# Patient Record
Sex: Female | Born: 1935 | Race: Black or African American | Hispanic: No | Smoking: Former smoker
Health system: Southern US, Community
[De-identification: ages and names within clinical notes are randomized; demographics above are authoritative.]

## PROBLEM LIST (undated history)

## (undated) DIAGNOSIS — I2510 Atherosclerotic heart disease of native coronary artery without angina pectoris: Secondary | ICD-10-CM

## (undated) DIAGNOSIS — M549 Dorsalgia, unspecified: Secondary | ICD-10-CM

## (undated) DIAGNOSIS — N200 Calculus of kidney: Secondary | ICD-10-CM

## (undated) DIAGNOSIS — E785 Hyperlipidemia, unspecified: Secondary | ICD-10-CM

## (undated) DIAGNOSIS — K219 Gastro-esophageal reflux disease without esophagitis: Secondary | ICD-10-CM

## (undated) DIAGNOSIS — I10 Essential (primary) hypertension: Secondary | ICD-10-CM

## (undated) DIAGNOSIS — G8929 Other chronic pain: Secondary | ICD-10-CM

## (undated) HISTORY — DX: Atherosclerotic heart disease of native coronary artery without angina pectoris: I25.10

## (undated) HISTORY — DX: Dorsalgia, unspecified: M54.9

## (undated) HISTORY — DX: Gastro-esophageal reflux disease without esophagitis: K21.9

## (undated) HISTORY — DX: Essential (primary) hypertension: I10

## (undated) HISTORY — DX: Other chronic pain: G89.29

## (undated) HISTORY — DX: Hyperlipidemia, unspecified: E78.5

---

## 1998-04-13 ENCOUNTER — Encounter: Admission: RE | Admit: 1998-04-13 | Discharge: 1998-05-04 | Payer: Self-pay | Admitting: Internal Medicine

## 1998-04-20 ENCOUNTER — Encounter: Admission: RE | Admit: 1998-04-20 | Discharge: 1998-05-04 | Payer: Self-pay | Admitting: Internal Medicine

## 1998-07-01 ENCOUNTER — Emergency Department (HOSPITAL_COMMUNITY): Admission: EM | Admit: 1998-07-01 | Discharge: 1998-07-01 | Payer: Self-pay | Admitting: Emergency Medicine

## 2004-11-11 HISTORY — PX: CARDIAC CATHETERIZATION: SHX172

## 2006-08-04 ENCOUNTER — Inpatient Hospital Stay (HOSPITAL_COMMUNITY): Admission: EM | Admit: 2006-08-04 | Discharge: 2006-08-06 | Payer: Self-pay | Admitting: Emergency Medicine

## 2006-08-04 ENCOUNTER — Ambulatory Visit: Payer: Self-pay | Admitting: Cardiology

## 2006-08-05 ENCOUNTER — Encounter: Payer: Self-pay | Admitting: Cardiovascular Disease

## 2006-08-06 ENCOUNTER — Ambulatory Visit: Payer: Self-pay

## 2006-12-16 ENCOUNTER — Encounter: Admission: RE | Admit: 2006-12-16 | Discharge: 2006-12-16 | Payer: Self-pay | Admitting: Internal Medicine

## 2008-01-15 ENCOUNTER — Encounter: Payer: Self-pay | Admitting: Emergency Medicine

## 2008-01-15 ENCOUNTER — Emergency Department (HOSPITAL_COMMUNITY): Admission: EM | Admit: 2008-01-15 | Discharge: 2008-01-15 | Payer: Self-pay | Admitting: Emergency Medicine

## 2008-02-27 ENCOUNTER — Emergency Department (HOSPITAL_COMMUNITY): Admission: EM | Admit: 2008-02-27 | Discharge: 2008-02-27 | Payer: Self-pay | Admitting: Emergency Medicine

## 2008-04-19 ENCOUNTER — Encounter: Admission: RE | Admit: 2008-04-19 | Discharge: 2008-04-19 | Payer: Self-pay | Admitting: Orthopedic Surgery

## 2008-07-25 ENCOUNTER — Encounter: Admission: RE | Admit: 2008-07-25 | Discharge: 2008-09-08 | Payer: Self-pay | Admitting: Orthopedic Surgery

## 2008-10-11 DIAGNOSIS — I2510 Atherosclerotic heart disease of native coronary artery without angina pectoris: Secondary | ICD-10-CM

## 2008-10-11 HISTORY — DX: Atherosclerotic heart disease of native coronary artery without angina pectoris: I25.10

## 2008-10-26 ENCOUNTER — Ambulatory Visit: Payer: Self-pay | Admitting: *Deleted

## 2008-10-26 ENCOUNTER — Encounter: Payer: Self-pay | Admitting: Emergency Medicine

## 2008-10-27 ENCOUNTER — Inpatient Hospital Stay (HOSPITAL_COMMUNITY): Admission: EM | Admit: 2008-10-27 | Discharge: 2008-10-29 | Payer: Self-pay | Admitting: Cardiology

## 2008-11-17 ENCOUNTER — Ambulatory Visit: Payer: Self-pay | Admitting: Cardiovascular Disease

## 2008-11-17 ENCOUNTER — Ambulatory Visit: Payer: Self-pay

## 2009-05-24 ENCOUNTER — Encounter: Admission: RE | Admit: 2009-05-24 | Discharge: 2009-05-24 | Payer: Self-pay | Admitting: Internal Medicine

## 2009-05-29 ENCOUNTER — Ambulatory Visit: Payer: Self-pay | Admitting: Cardiovascular Disease

## 2009-05-29 DIAGNOSIS — I2510 Atherosclerotic heart disease of native coronary artery without angina pectoris: Secondary | ICD-10-CM

## 2009-05-29 DIAGNOSIS — I10 Essential (primary) hypertension: Secondary | ICD-10-CM

## 2009-05-29 DIAGNOSIS — E782 Mixed hyperlipidemia: Secondary | ICD-10-CM | POA: Insufficient documentation

## 2009-06-16 ENCOUNTER — Encounter: Admission: RE | Admit: 2009-06-16 | Discharge: 2009-06-16 | Payer: Self-pay | Admitting: Internal Medicine

## 2009-11-15 ENCOUNTER — Ambulatory Visit: Payer: Self-pay | Admitting: Cardiovascular Disease

## 2009-12-19 ENCOUNTER — Ambulatory Visit (HOSPITAL_COMMUNITY): Admission: RE | Admit: 2009-12-19 | Discharge: 2009-12-19 | Payer: Self-pay | Admitting: Cardiovascular Disease

## 2009-12-19 ENCOUNTER — Ambulatory Visit: Payer: Self-pay | Admitting: Cardiovascular Disease

## 2009-12-19 ENCOUNTER — Ambulatory Visit: Payer: Self-pay | Admitting: Cardiology

## 2010-04-12 ENCOUNTER — Ambulatory Visit: Payer: Self-pay | Admitting: Cardiovascular Disease

## 2010-04-24 ENCOUNTER — Emergency Department (HOSPITAL_COMMUNITY): Admission: EM | Admit: 2010-04-24 | Discharge: 2010-04-24 | Payer: Self-pay | Admitting: Emergency Medicine

## 2010-09-28 ENCOUNTER — Encounter: Payer: Self-pay | Admitting: Internal Medicine

## 2010-10-16 ENCOUNTER — Ambulatory Visit: Payer: Self-pay | Admitting: Cardiovascular Disease

## 2010-12-02 ENCOUNTER — Encounter: Payer: Self-pay | Admitting: Internal Medicine

## 2010-12-03 ENCOUNTER — Encounter: Payer: Self-pay | Admitting: Internal Medicine

## 2010-12-11 NOTE — Assessment & Plan Note (Signed)
Summary: per check out/sf   Visit Type:  Follow-up Primary Provider:  Andi Devon, MD  CC:  ROV; No complaints (Meds per memory-no list).  History of Present Illness: Kiara Watkins is a pleasant 77 year old African American female with a past medical history significant for hypertension, hyperlipidemia, and coronary artery disease who presented to [facility] in December 2009 with a non-ST elevation myocardial infarction.  Left heart catheterization on October 27, 2008, showed a totally occluded first obtuse marginal branch and otherwise nonobstructive disease with normal LV function. The patient was treated with a 2.75- x 23-mm XIENCE drug-eluting stent.  She tells me that she woke up with a bad headache today and has been a little dizzy. She has been doing well, no chest pain, shortness of breath, near syncope, syncope, lower ext edema. She has continued to take her Plavix on a daily basis.    Current Medications (verified): 1)  Plavix 75 Mg Tabs (Clopidogrel Bisulfate) .Marland Kitchen.. 1 Tab Once Daily 2)  Lisinopril 10 Mg Tabs (Lisinopril) .Marland Kitchen.. 1 Tab Once Daily 3)  Metoprolol Tartrate 25 Mg Tabs (Metoprolol Tartrate) .Marland Kitchen.. 1 Tab Two Times A Day 4)  Simvastatin 40 Mg Tabs (Simvastatin) .Marland Kitchen.. 1 Tab At Bedtime 5)  Aspirin 81 Mg Tbec (Aspirin) .... Take One Tablet By Mouth Daily 6)  Nitroglycerin 0.4 Mg Subl (Nitroglycerin) .... One Tablet Under Tongue Every 5 Minutes As Needed For Chest Pain---May Repeat Times Three  Allergies (verified): No Known Drug Allergies  Past History:  Past Medical History: Reviewed history from 11/17/2008 and no changes required. 1. Coronary artery disease status post successful percutaneous       coronary intervention and stenting of the first obtuse marginal       with placement of a 2.75 x 23 mm XIENCE V drug-eluting 12/09  2. Hypertension.   3. Hyperlipidemia.   4. Gastroesophageal reflux disease.   5. Chronic back pain.   Social History: Reviewed  history from 11/15/2009 and no changes required. The patient lives in [place] and currently cares for   her 9 great-great-great-grandchildren. SHe has a total of 56 grandchildren, great grands and great great grands  She has 9 children of her own  who are alive and well.   She quit smoking approximately 30 years ago as   well as alcohol.   Review of Systems       The patient complains of headache and dizziness.  The patient denies fatigue, malaise, fever, weight gain/loss, vision loss, decreased hearing, hoarseness, chest pain, palpitations, shortness of breath, prolonged cough, wheezing, sleep apnea, coughing up blood, abdominal pain, blood in stool, nausea, vomiting, diarrhea, heartburn, incontinence, blood in urine, muscle weakness, joint pain, leg swelling, rash, skin lesions, fainting, depression, anxiety, enlarged lymph nodes, easy bruising or bleeding, and environmental allergies.    Vital Signs:  Patient profile:   77 year old female Height:      64 inches Weight:      150 pounds BMI:     25.84 Pulse rate:   68 / minute Pulse rhythm:   regular BP sitting:   102 / 42  (left arm) Cuff size:   regular  Vitals Entered By: Stanton Kidney, EMT-P (April 12, 2010 2:14 PM)  Physical Exam  General:  General: Well developed, well nourished, NAD Neuro: No focal deficits Musculoskeletal: Muscle strength 5/5 all ext Psychiatric: Mood and affect normal Neck: No JVD, no carotid bruits, no thyromegaly, no lymphadenopathy. Lungs:Clear bilaterally, no wheezes, rhonci, crackles CV: RRR  no murmurs, gallops rubs Abdomen: soft, NT, ND, BS present Extremities: No edema, pulses 2+.    EKG  Procedure date:  04/12/2010  Findings:      Sinus rhythm, rate 70 bpm. 1st degree AV block.   Impression & Recommendations:  Problem # 1:  CAD, NATIVE VESSEL (ICD-414.01) Stable. She is now 18 months post NSTEMI with drug eluting stent. Will stop Plavix. Continue ASA, beta blocker, statin, ace-inh.    Her updated medication list for this problem includes:    Plavix 75 Mg Tabs (Clopidogrel bisulfate) .Marland Kitchen... 1 tab once daily    Lisinopril 10 Mg Tabs (Lisinopril) .Marland Kitchen... 1 tab once daily    Metoprolol Tartrate 25 Mg Tabs (Metoprolol tartrate) .Marland Kitchen... 1 tab two times a day    Aspirin 81 Mg Tbec (Aspirin) .Marland Kitchen... Take one tablet by mouth daily    Nitroglycerin 0.4 Mg Subl (Nitroglycerin) ..... One tablet under tongue every 5 minutes as needed for chest pain---may repeat times three  Problem # 2:  ESSENTIAL HYPERTENSION, BENIGN (ICD-401.1) Well controlled.  No changes.  Her updated medication list for this problem includes:    Lisinopril 10 Mg Tabs (Lisinopril) .Marland Kitchen... 1 tab once daily    Metoprolol Tartrate 25 Mg Tabs (Metoprolol tartrate) .Marland Kitchen... 1 tab two times a day    Aspirin 81 Mg Tbec (Aspirin) .Marland Kitchen... Take one tablet by mouth daily  Problem # 3:  MIXED HYPERLIPIDEMIA (ICD-272.2) Folllowed in primary care. She will need yearly lipids with goal LDL 70.   Her updated medication list for this problem includes:    Simvastatin 40 Mg Tabs (Simvastatin) .Marland Kitchen... 1 tab at bedtime  Patient Instructions: 1)  Your physician recommends that you schedule a follow-up appointment in: 6 months 2)  Your physician has recommended you make the following change in your medication: Stop Plavix when you finish this bottle.

## 2010-12-11 NOTE — Assessment & Plan Note (Signed)
Summary: Z6X  Medications Added AMLODIPINE BESY-BENAZEPRIL HCL 10-20 MG CAPS (AMLODIPINE BESY-BENAZEPRIL HCL) 1 tab once daily METOPROLOL TARTRATE 25 MG TABS (METOPROLOL TARTRATE) 1 tab once daily      Allergies Added: NKDA  Visit Type:  6 mo f/u Primary Provider:  Andi Devon, MD  CC:  pt offers no cardiac complaints today.  History of Present Illness: Kiara Watkins is a pleasant 77 year old African American female with a past medical history significant for hypertension, hyperlipidemia, and coronary artery disease who presented to [facility] in December 2009 with a non-ST elevation myocardial infarction.  Left heart catheterization on October 27, 2008, showed a totally occluded first obtuse marginal branch and otherwise nonobstructive disease with normal LV function. The patient was treated with a 2.75- x 23-mm XIENCE drug-eluting stent.  She has been doing well, no chest pain, shortness of breath, near syncope, syncope, lower ext edema.    Current Medications (verified): 1)  Amlodipine Besy-Benazepril Hcl 10-20 Mg Caps (Amlodipine Besy-Benazepril Hcl) .Marland Kitchen.. 1 Tab Once Daily 2)  Metoprolol Tartrate 25 Mg Tabs (Metoprolol Tartrate) .Marland Kitchen.. 1 Tab Once Daily 3)  Simvastatin 40 Mg Tabs (Simvastatin) .Marland Kitchen.. 1 Tab At Bedtime 4)  Aspirin 81 Mg Tbec (Aspirin) .... Take One Tablet By Mouth Daily 5)  Nitroglycerin 0.4 Mg Subl (Nitroglycerin) .... One Tablet Under Tongue Every 5 Minutes As Needed For Chest Pain---May Repeat Times Three  Allergies (verified): No Known Drug Allergies  Past History:  Past Medical History: Reviewed history from 11/17/2008 and no changes required. 1. Coronary artery disease status post successful percutaneous       coronary intervention and stenting of the first obtuse marginal       with placement of a 2.75 x 23 mm XIENCE V drug-eluting 12/09  2. Hypertension.   3. Hyperlipidemia.   4. Gastroesophageal reflux disease.   5. Chronic back pain.    Social History: Reviewed history from 11/15/2009 and no changes required. The patient lives in [place] and currently cares for   her 9 great-great-great-grandchildren. SHe has a total of 56 grandchildren, great grands and great great grands  She has 9 children of her own  who are alive and well.   She quit smoking approximately 30 years ago as   well as alcohol.   Review of Systems  The patient denies fatigue, malaise, fever, weight gain/loss, vision loss, decreased hearing, hoarseness, chest pain, palpitations, shortness of breath, prolonged cough, wheezing, sleep apnea, coughing up blood, abdominal pain, blood in stool, nausea, vomiting, diarrhea, heartburn, incontinence, blood in urine, muscle weakness, joint pain, leg swelling, rash, skin lesions, headache, fainting, dizziness, depression, anxiety, enlarged lymph nodes, easy bruising or bleeding, and environmental allergies.    Vital Signs:  Patient profile:   77 year old female Height:      64 inches Weight:      155.50 pounds BMI:     26.79 Pulse rate:   72 / minute Pulse rhythm:   regular BP sitting:   142 / 68  (left arm) Cuff size:   regular  Vitals Entered By: Kiara Watkins, Kiara Watkins (October 16, 2010 2:07 PM)  Physical Exam  General:  General: Well developed, well nourished, NAD HEENT: OP clear, mucus membranes moist SKIN: warm, dry Neuro: No focal deficits Musculoskeletal: Muscle strength 5/5 all ext Psychiatric: Mood and affect normal Neck: No JVD, no carotid bruits, no thyromegaly, no lymphadenopathy. Lungs:Clear bilaterally, no wheezes, rhonci, crackles CV: RRR no murmurs, gallops rubs Abdomen: soft, NT, ND, BS  present Extremities: No edema, pulses 2+.    Impression & Recommendations:  Problem # 1:  CAD, NATIVE VESSEL (ICD-414.01) Stable. No angina. Continue current meds. We will ask Dr. Renae Gloss to check her fasting lipids at her primary care follow up.   The following medications were removed from the  medication list:    Plavix 75 Mg Tabs (Clopidogrel bisulfate) .Marland Kitchen... 1 tab once daily Her updated medication list for this problem includes:    Amlodipine Besy-benazepril Hcl 10-20 Mg Caps (Amlodipine besy-benazepril hcl) .Marland Kitchen... 1 tab once daily    Metoprolol Tartrate 25 Mg Tabs (Metoprolol tartrate) .Marland Kitchen... 1 tab once daily    Aspirin 81 Mg Tbec (Aspirin) .Marland Kitchen... Take one tablet by mouth daily    Nitroglycerin 0.4 Mg Subl (Nitroglycerin) ..... One tablet under tongue every 5 minutes as needed for chest pain---may repeat times three  The following medications were removed from the medication list:    Plavix 75 Mg Tabs (Clopidogrel bisulfate) .Marland Kitchen... 1 tab once daily Her updated medication list for this problem includes:    Lisinopril 10 Mg Tabs (Lisinopril) .Marland Kitchen... 1 tab once daily    Metoprolol Tartrate 25 Mg Tabs (Metoprolol tartrate) .Marland Kitchen... 1 tab two times a day    Aspirin 81 Mg Tbec (Aspirin) .Marland Kitchen... Take one tablet by mouth daily    Nitroglycerin 0.4 Mg Subl (Nitroglycerin) ..... One tablet under tongue every 5 minutes as needed for chest pain---may repeat times three  Problem # 2:  ESSENTIAL HYPERTENSION, BENIGN (ICD-401.1) Borderline elevation today. Follow up with Dr. Renae Gloss soon and will have rechecked. She tells me that her BP has been ok at all recent checks.   Her updated medication list for this problem includes:    Amlodipine Besy-benazepril Hcl 10-20 Mg Caps (Amlodipine besy-benazepril hcl) .Marland Kitchen... 1 tab once daily    Metoprolol Tartrate 25 Mg Tabs (Metoprolol tartrate) .Marland Kitchen... 1 tab once daily    Aspirin 81 Mg Tbec (Aspirin) .Marland Kitchen... Take one tablet by mouth daily  Patient Instructions: 1)  Your physician recommends that you schedule a follow-up appointment in: 6 months 2)  Your physician recommends that you continue on your current medications as directed. Please refer to the Current Medication list given to you today.

## 2010-12-11 NOTE — Letter (Signed)
Summary: CLINIC NOTE FROM [facility]  CLINIC NOTE FROM [facility]   Imported By: Rexene Alberts 09/28/2010 10:40:35  _____________________________________________________________________  External Attachment:    Type:   Image     Comment:   External Document

## 2010-12-11 NOTE — Assessment & Plan Note (Signed)
Summary: F6M/ANAS      Allergies Added: NKDA  Visit Type:  6 mo f/u Primary Provider:  Andi Devon  CC:  no cardiac complaints today.  History of Present Illness: Kiara Watkins is a pleasant 77 year old African American female with a past medical history significant for hypertension, hyperlipidemia, and coronary artery disease who presented to [facility] in December 2009 with a non-ST elevation myocardial infarction.  Left heart catheterization on October 27, 2008, showed a totally occluded first obtuse marginal branch and otherwise nonobstructive disease with normal LV function. The patient was treated with a 2.75- x 23-mm XIENCE drug-eluting stent.  She has been doing well, but does describe several episodes of mild chest discomfort for which she has taken nitroglycerin.  Chest pain is left sided, mild in nature, and resolves completely with one sublingual nitroglycerin.  No shortness of breath, near syncope, syncope, lower ext edema. She has continued to take her Plavix on a daily basis.  She recently went to the beach with her kids and felt great. She is planning a trip to the [place] soon.   Current Medications (verified): 1)  Plavix 75 Mg Tabs (Clopidogrel Bisulfate) .Marland Kitchen.. 1 Tab Once Daily 2)  Lisinopril 10 Mg Tabs (Lisinopril) .Marland Kitchen.. 1 Tab Once Daily 3)  Metoprolol Tartrate 25 Mg Tabs (Metoprolol Tartrate) .Marland Kitchen.. 1 Tab Two Times A Day 4)  Simvastatin 40 Mg Tabs (Simvastatin) .Marland Kitchen.. 1 Tab At Bedtime 5)  Aspirin 81 Mg Tbec (Aspirin) .... Take One Tablet By Mouth Daily 6)  Nitroglycerin 0.4 Mg Subl (Nitroglycerin) .... One Tablet Under Tongue Every 5 Minutes As Needed For Chest Pain---May Repeat Times Three  Allergies (verified): No Known Drug Allergies  Past History:  Past Medical History: Reviewed history from 11/17/2008 and no changes required. 1. Coronary artery disease status post successful percutaneous       coronary intervention and stenting of the first obtuse  marginal       with placement of a 2.75 x 23 mm XIENCE V drug-eluting 12/09  2. Hypertension.   3. Hyperlipidemia.   4. Gastroesophageal reflux disease.   5. Chronic back pain.   Social History: Reviewed history from 11/17/2008 and no changes required. The patient lives in [place] and currently cares for   her 9 great-great-great-grandchildren. SHe has a total of 56 grandchildren, great grands and great great grands  She has 9 children of her own  who are alive and well.   She quit smoking approximately 30 years ago as   well as alcohol.   Review of Systems  The patient denies fatigue, malaise, fever, weight gain/loss, vision loss, decreased hearing, hoarseness, chest pain, palpitations, shortness of breath, prolonged cough, wheezing, sleep apnea, coughing up blood, abdominal pain, blood in stool, nausea, vomiting, diarrhea, heartburn, incontinence, blood in urine, muscle weakness, joint pain, leg swelling, rash, skin lesions, headache, fainting, dizziness, depression, anxiety, enlarged lymph nodes, easy bruising or bleeding, and environmental allergies.    Vital Signs:  Patient profile:   77 year old female Height:      66 inches Weight:      154 pounds BMI:     24.95 Pulse rate:   86 / minute Pulse rhythm:   regular BP sitting:   130 / 76  (left arm) Cuff size:   large  Vitals Entered By: Danielle Rankin, CMA (November 15, 2009 3:50 PM)  Physical Exam  General:  General: Well developed, well nourished, NAD HEENT: OP clear, mucus membranes moist SKIN: warm,  dry Neuro: No focal deficits Musculoskeletal: Muscle strength 5/5 all ext Psychiatric: Mood and affect normal Neck: No JVD, no carotid bruits, no thyromegaly, no lymphadenopathy. Lungs:Clear bilaterally, no wheezes, rhonci, crackles CV: RRR no murmurs, gallops rubs Abdomen: soft, NT, ND, BS present Extremities: No edema, pulses 2+.    Impression & Recommendations:  Problem # 1:  CAD, NATIVE VESSEL  (ICD-414.01) Stable. Continue current meds.  Will check echo in next few weeks.   Her updated medication list for this problem includes:    Plavix 75 Mg Tabs (Clopidogrel bisulfate) .Marland Kitchen... 1 tab once daily    Lisinopril 10 Mg Tabs (Lisinopril) .Marland Kitchen... 1 tab once daily    Metoprolol Tartrate 25 Mg Tabs (Metoprolol tartrate) .Marland Kitchen... 1 tab two times a day    Aspirin 81 Mg Tbec (Aspirin) .Marland Kitchen... Take one tablet by mouth daily    Nitroglycerin 0.4 Mg Subl (Nitroglycerin) ..... One tablet under tongue every 5 minutes as needed for chest pain---may repeat times three  Orders: Echocardiogram (Echo)  Problem # 2:  ESSENTIAL HYPERTENSION, BENIGN (ICD-401.1) Well controlled on current therapy.   Her updated medication list for this problem includes:    Lisinopril 10 Mg Tabs (Lisinopril) .Marland Kitchen... 1 tab once daily    Metoprolol Tartrate 25 Mg Tabs (Metoprolol tartrate) .Marland Kitchen... 1 tab two times a day    Aspirin 81 Mg Tbec (Aspirin) .Marland Kitchen... Take one tablet by mouth daily  Problem # 3:  MIXED HYPERLIPIDEMIA (ICD-272.2) Per Dr. Renae Gloss.  Her updated medication list for this problem includes:    Simvastatin 40 Mg Tabs (Simvastatin) .Marland Kitchen... 1 tab at bedtime  Patient Instructions: 1)  Your physician recommends that you schedule a follow-up appointment in: 6 months 2)  Your physician has requested that you have an echocardiogram.  Echocardiography is a painless test that uses sound waves to create images of your heart. It provides your doctor with information about the size and shape of your heart and how well your heart's chambers and valves are working.  This procedure takes approximately one hour. There are no restrictions for this procedure.

## 2011-01-28 LAB — URINE CULTURE
Colony Count: NO GROWTH
Culture: NO GROWTH

## 2011-01-28 LAB — URINALYSIS, ROUTINE W REFLEX MICROSCOPIC
Glucose, UA: NEGATIVE mg/dL
Ketones, ur: NEGATIVE mg/dL
Nitrite: NEGATIVE
Protein, ur: NEGATIVE mg/dL
Specific Gravity, Urine: 1.01 (ref 1.005–1.030)
Specific Gravity, Urine: 1.023 (ref 1.005–1.030)
Urobilinogen, UA: 1 mg/dL (ref 0.0–1.0)

## 2011-01-28 LAB — POCT I-STAT, CHEM 8
BUN: 14 mg/dL (ref 6–23)
Glucose, Bld: 86 mg/dL (ref 70–99)
Hemoglobin: 15.3 g/dL — ABNORMAL HIGH (ref 12.0–15.0)
Potassium: 3.7 mEq/L (ref 3.5–5.1)

## 2011-01-28 LAB — CBC
HCT: 39.5 % (ref 36.0–46.0)
MCHC: 31.7 g/dL (ref 30.0–36.0)
MCV: 86.2 fL (ref 78.0–100.0)
Platelets: 218 10*3/uL (ref 150–400)
RDW: 15.3 % (ref 11.5–15.5)
WBC: 5.9 10*3/uL (ref 4.0–10.5)

## 2011-01-28 LAB — URINE MICROSCOPIC-ADD ON

## 2011-01-28 LAB — DIFFERENTIAL
Basophils Absolute: 0 10*3/uL (ref 0.0–0.1)
Neutro Abs: 4.5 10*3/uL (ref 1.7–7.7)
Neutrophils Relative %: 76 % (ref 43–77)

## 2011-01-28 LAB — POCT CARDIAC MARKERS: Troponin i, poc: <0.05 ng/mL (ref 0.00–0.09)

## 2011-03-26 NOTE — Cardiovascular Report (Signed)
NAME:  GEORGEANNA, RADZIEWICZ                 ACCOUNT NO.:  [id number]   MEDICAL RECORD NO.:  [id number]          PATIENT TYPE:  INP   LOCATION:  3313                         FACILITY:  [facility]   PHYSICIAN:  Verne Carrow, MDDATE OF BIRTH:  03-26-1927   DATE OF PROCEDURE:  10/27/2008  DATE OF DISCHARGE:                            CARDIAC CATHETERIZATION   PROCEDURES PERFORMED:  1. Left heart catheterization.  2. Selective coronary angiography.  3. Left ventricular angiogram.  4. Percutaneous coronary intervention with placement of a drug-eluting      stent in the first obtuse marginal branch.   OPERATOR:  Verne Carrow, MD   INDICATION:  Non-ST elevation myocardial infarction.   DETAILS OF PROCEDURE:  The patient was brought to the cardiac  catheterization laboratory after signing informed consent for the  procedure.  The right groin was prepped and draped in sterile fashion.  A 1% lidocaine was used for local anesthesia.  A 5-French sheath was  inserted into the right femoral artery without difficulty.  I initially  had trouble selectively engaging the right coronary artery with the JR-4  catheter and a No Torque right catheter.  I then proceeded to use the  standard JL-4 diagnostic catheter to selectively engage and inject the  left coronary system.  I then used an AR-1 catheter to selectively  inject the right coronary artery.  At this point, we turned our  attention to the totally occluded obtuse marginal branch.  The patient  was given an Angiomax bolus and started on a drip.  She was then given  600 mg of Plavix.  A Whisper wire was then passed down the circumflex  artery into the obtuse marginal branch and through the area of  obstruction.  A long wire was used for this procedure.  I then used an  over-the-wire balloon and passed this down over the wire.  The wire was  removed and contrast was injected through the balloon to establish that  we were truly in the lumen.   There was good opacification of the distal  vessel after injecting through the balloon.  I was then satisfied that  my wire had been in the appropriate location in the distal vessel.  The  wire was reinserted and the 1.5 x 12-mm balloon was inflated at the area  of stenosis.  This established TIMI 3 flow down the length of the  vessel.  A 2.5 x 12-mm balloon was then inflated in the area of  stenosis.  A 2.75 x 23-mm Xience drug-eluting stent was then deployed in  the area of stenosis.  A 3.0 x 20-mm noncompliant balloon was used for  postdilatation of the lesion.  There was an excellent angiographic  result.  The patient's Angiomax was stopped in the cath lab.  She was  taken to the holding area in stable condition.   ANGIOGRAPHIC FINDINGS:  1. The left main coronary artery had no evidence of disease.  2. The left anterior descending is a large vessel that courses to the      apex and gives off a large branching diagonal  vessel.  There are      serial 30% lesions noted in the proximal and mid LAD.  There is no      severe obstructive disease noted in this vessel.  There is plaque      disease noted in the diagonal branch.  3. Circumflex artery gives rise to 2 large obtuse marginal branches.      There is plaque disease noted in the proximal circumflex and a 40%      stenosis noted in the mid circumflex.  The AV groove circumflex      vessel was small.  The first obtuse marginal is a large vessel that      was totally occluded at the beginning of the procedure.  The second      obtuse marginal is a moderate-sized vessel that has plaque disease.  4. The right coronary artery has serial 50% lesions in the proximal      and mid areas.  This is a codominant vessel.  There was no severe      obstructive disease noted in this vessel.  5. Left ventricular angiogram demonstrates normal systolic function      with no wall motion abnormalities.  Ejection fraction 65%.   HEMODYNAMIC DATA:   Central aortic pressure 102/50, LV pressure 151/8,  end-diastolic pressure 14.   IMPRESSION:  1. Successful percutaneous coronary intervention of the totally      occluded obtuse marginal branch of the circumflex system.  2. Normal left ventricular systolic function.  3. Nonobstructive disease noted in the left anterior descending      coronary artery and the right coronary artery.   RECOMMENDATIONS:  The patient will be transferred to the Step-Down ICU.  She will be monitored closely today and through the evening.  She will  be discharged to home tomorrow if she does well.  The patient should be  continued on aspirin and Plavix for at least 1 year.       Verne Carrow, MD  Electronically Signed     CM/MEDQ  D:  10/27/2008  T:  10/28/2008  Job:  [id number]

## 2011-03-26 NOTE — Discharge Summary (Signed)
NAMEMYKIAH, SCHMUCK                 ACCOUNT NO.:  [id number]   MEDICAL RECORD NO.:  [id number]          PATIENT TYPE:  INP   LOCATION:  2027                         FACILITY:  [facility]   PHYSICIAN:  Jesse Sans. Wall, MD, FACCDATE OF BIRTH:  02-Feb-1927   DATE OF ADMISSION:  10/27/2008  DATE OF DISCHARGE:  10/29/2008                               DISCHARGE SUMMARY   PRIMARY CARDIOLOGIST:  Verne Carrow, MD   PRIMARY CARE PHYSICIAN:  Merlene Laughter. Renae Gloss, MD   DISCHARGE DIAGNOSIS:  Non-ST-segment elevation myocardial infarction.   SECONDARY DIAGNOSES:  1. Coronary artery disease status post successful percutaneous      coronary intervention and stenting of the first obtuse marginal      with placement of a 2.75 x 23 mm XIENCE V drug-eluting stent this      admission.  2. Hypertension.  3. Hyperlipidemia.  4. Gastroesophageal reflux disease.  5. Chronic back pain.   ALLERGIES:  No known drug allergies.   PROCEDURE:  Left heart cardiac catheterization revealing a large  occluded first obtuse marginal, otherwise, nonobstructive disease with  an ejection fraction of 65% without regional wall motion abnormalities.  The obtuse marginal was successfully stented with a XIENCE V drug-  eluting stent as above.   HISTORY OF PRESENT ILLNESS:  This is a 77 year old African American  female with prior history of hypertension, hyperlipidemia who was in her  usual state of health until 2 days prior to admission when she began to  experience exertional chest discomfort while performing housework.  Symptoms acutely worsened on the evening of October 25, 2008, awaking  the patient from sleep and becoming associated with nausea, vomiting,  and dyspnea.  She presented to the [facility] where she was  noted to be hypertensive with systolic pressures in the 190s.  ECG was  nonacute.  Her cardiac markers were abnormal, and the patient was  transferred to [facility] for evaluation and  management of non-ST-  segment elevation myocardial infarction.   HOSPITAL COURSE:  The patient eventually peaked her CK at 1116, MB at  170.9, and troponin I 33.92.  She did have recurrent chest discomfort on  October 27, 2008, and was taken semi-urgently to the Cath Lab where  catheterization revealed an occluded large first obtuse marginal with  otherwise nonobstructive disease and normal LV function.  The obtuse  marginal was felt to be the culprit vessel and this was successfully  stented with a 2.75 x 23 mm XIENCE V drug-eluting stent.  The patient  was maintained on aspirin, Plavix, beta-blocker, and statin and ACE  inhibitor postprocedure, all of which she has tolerated.  She was  transferred out to the floor on October 28, 2008, has been ambulating  without recurrent symptoms or limitations.  She has been evaluated by  Cardiac Rehab and outpatient referral has been made.   Notably, Ms. Haggar had a chest x-ray during this admission, which  showed right lower lobe opacity suspicious for pneumonia versus  atelectasis.  She has been afebrile with stable white blood cell count  throughout this admission.  There is no history of cough, chills, or  other respiratory symptoms.  We recommended followup chest x-ray as an  outpatient and this can be done with Dr. Renae Gloss.   DISCHARGE LABORATORY DATA:  Hemoglobin 10.7, hematocrit 33.5, WBC 5.6,  platelets 171, MCV 85.4.  Sodium 137, potassium 3.6, chloride 107, CO2  21, BUN 12, creatinine 0.81, glucose 96.  Total bilirubin 0.4, alkaline  phosphatase 134, AST 42, ALT 17, albumin 3.5.  CK 1116, MB 170.9,  troponin I 33.92.  Total cholesterol 183, triglycerides 42, HDL 47, LDL  128, calcium 9.5.   DISPOSITION:  The patient will be discharged home today in good  condition.   FOLLOWUP PLANS AND APPOINTMENTS:  We will arrange for followup with Dr.  Clifton James in approximately 2 weeks.  We have asked her to follow up with  Dr. Renae Gloss in 1  week.  The patient will require reevaluation of his  chest x-ray secondary to right lower lobe opacity.   DISCHARGE MEDICATIONS:  1. Aspirin 325 mg daily.  2. Plavix 75 mg daily.  3. Lisinopril 10 mg daily.  4. Metoprolol 25 mg b.i.d.  5. Simvastatin 40 mg nightly.  6. Nitroglycerin 0.4 mg sublingual p.r.n. chest pain.   OUTSTANDING LABORATORY DATA AND STUDIES:  Chest x-ray.   DURATION OF DISCHARGE ENCOUNTER:  60 minutes including physician time.      Nicolasa Ducking, ANP      Jesse Sans. Daleen Squibb, MD, [facility]  Electronically Signed    CB/MEDQ  D:  10/29/2008  T:  10/29/2008  Job:  [id number]   cc:   Merlene Laughter. Renae Gloss, M.D.

## 2011-03-26 NOTE — H&P (Signed)
NAMESANYIA, DINI                 ACCOUNT NO.:  [id number]   MEDICAL RECORD NO.:  [id number]          PATIENT TYPE:  INP   LOCATION:  2104                         FACILITY:  [facility]   PHYSICIAN:  Audery Amel, MD    DATE OF BIRTH:  [birth year]   DATE OF ADMISSION:  10/27/2008  DATE OF DISCHARGE:                              HISTORY & PHYSICAL   CHIEF COMPLAINT:  Chest pain.   HISTORY OF PRESENT ILLNESS:  Ms. Lakatos is a 77 year old African  American female with past medical history notable hypertension and  hyperlipidemia who presents to the emergency department with a chief  complaint chest pain.  The patient states that her symptoms began on  Monday while doing housework.  She characterizes a dull substernal chest  discomfort without radiation.  She denies any associated shortness of  breath or dyspnea on exertion but does note nausea with one episode of  emesis.  The patient states that her symptoms improved after the episode  of emesis, and therefore, she felt she had indigestion.  On Tuesday  evening, she was awakened from her sleep with very similar symptoms.  She took an Alka-Seltzer with some relief of her symptoms and went back  to bed.  On Wednesday, she again had chest discomfort while hanging  curtains which was very similar to the previously described.  At this  point, she presented to the emergency department for further evaluation.  On arrival, the patient was notably hypertensive with systolic in the  474Q.  Her initial EKG revealed normal sinus rhythm with no evidence of  acute injury or ischemia.  Her initial cardiac biomarkers by point of  care testing were positive.  In addition, she developed 8/10 chest  discomfort which was resolved with sublingual nitroglycerin x1.  On my  initial evaluation, the patient was hemodynamically stable but remained  significantly hypertensive.  She was otherwise without complaints.   PAST MEDICAL HISTORY:  1. Hypertension.  2.  Hyperlipidemia.  3. Chronic low-back pain.  4. Status post right shoulder surgery.   CURRENT MEDICATIONS:  1. Zocor 20 mg daily  2. Metoprolol 50 mg daily  3. Naproxen 500 mg b.i.d. p.r.n.   DRUG ALLERGIES:  No known drug allergies.   SOCIAL HISTORY:  The patient lives in [place] and currently cares for  her 9 great-great-great-grandchildren.  She has 9 children of her own  who are alive and well.  She quit smoking approximately 30 years ago as  well as alcohol.   FAMILY HISTORY:  There is no general history of coronary artery disease  in her family.   REVIEW OF SYSTEMS:  As per HPI.  Otherwise, a complete review of system  was obtained and as documented.   PHYSICAL EXAMINATION:  Blood pressure is 191/82, heart rate of 79, O2  saturations are 96 on room air.  GENERAL:  The patient is alert and oriented x3 in no acute stress,  pleasantly conversant.  HEENT:  Normocephalic, atraumatic, EOMI, PERL, nares patent, OP clear  without erythema or exudate.  NECK:  Supple, full range of motion,  and no appreciable JVD.  There is  no palpable thyromegaly or lymphadenopathy.  Carotid upstrokes are equal  and symmetric bilaterally.  No audible bruits.  CHEST:  Clear to auscultation bilaterally.  CARDIOVASCULAR:  Normal S1-S2 with a positive S4.  There is no audible  rubs or gallops.  Her PMI is laterally displaced in the left axillary  line.  ABDOMEN:  Soft, nontender, nondistended.  Positive bowel sounds.  No  hepatosplenomegaly.  EXTREMITIES:  Reveal trace bilateral lower extremity edema.  There is no  clubbing, cyanosis, inflammation, or ulceration.  NEUROLOGIC:  Grossly nonfocal.  PSYCHIATRIC:  Appropriate insight and judgment.   DATA REVIEWED:  Labs:  White count 6.5, hematocrit 42, platelet 231.  Sodium 142, potassium 3.5, chloride 109, BUN 11, creatinine 1.2, glucose  106, CO2 is 23.  CK 309, CK-MB 3.5, troponin I 0.51.   EKG:  Normal sinus rhythm with nonspecific ST  changes.  There is no  evidence of acute injury or ischemia.   IMPRESSION:  1. Non-ST-elevation myocardial infarction.  2. Hypertensive urgency.  3. Hyperlipidemia.  4. Gastroesophageal reflux disease.   PLAN:  Admit the patient to telemetry for further evaluation and  monitoring.  Will cycle cardiac biomarkers to establish trend.  She has  been initiated on unfractionated heparin drip, and we will continue with  this mode of anticoagulation.  We will continue aspirin 325 mg once  daily.  There is no indication for a 2B3 inhibitor at this point.  She  remains notably hypertensive, and we will initiate metoprolol 25 mg p.o.  q.6 h. as well as lisinopril 10 mg daily.  We will continue her  simvastatin but increase the dose 40 mg every night.  The patient will  likely require cardiac catheterization in the a.m.  We will make the  patient n.p.o. after midnight and administer IV fluids with Mucomyst.  She is currently at [facility].  However, we will need to arrange for  transfer to [facility] in the a.m.      Audery Amel, MD  Electronically Signed     SHG/MEDQ  D:  10/26/2008  T:  10/27/2008  Job:  [id number]

## 2011-03-26 NOTE — Assessment & Plan Note (Signed)
[facility]                            CARDIOLOGY OFFICE NOTE   NAME:Tiley, CHONTE RICKE                        MRN:          [id number]  DATE:11/17/2008                            DOB:          1927-03-20    PRIMARY CARE PHYSICIAN:  Merlene Laughter. Renae Gloss, MD   HISTORY OF PRESENT ILLNESS:  Kiara Watkins is a pleasant 77 year old  African American female with a past medical history significant for  hypertension, hyperlipidemia, and a new diagnosis of coronary artery  disease when she presented to [facility] in December 2009 with  a non-ST elevation myocardial infarction.  Left heart catheterization on  October 27, 2008, showed a totally occluded first obtuse marginal  branch and otherwise nonobstructive disease with normal LV function.  The patient was treated with a 2.75- x 23-mm XIENCE drug-eluting stent.  She was maintained on aspirin, Plavix, beta-blocker, a statin  medication, and an ACE inhibitor postprocedure and did well.  She was  discharged to home on October 29, 2008, and comes in today for her  first visit in our office.  She has been doing well, but does describe  several episodes of mild chest discomfort for which she has taken  nitroglycerin.  Chest pain is left sided, mild in nature, and resolves  completely with one sublingual nitroglycerin.  She has unfortunately not  been taking a full-strength aspirin as recommended on her discharge  planning.  She has continued to take her Plavix on a daily basis.  She  was restarted on cholesterol medication while in the hospital and has  been taking this as asked.  I have also reviewed her medications with  her today and found that she has been taking Lotrel combination pill  which consists of benazepril and amlodipine.  We discharged her on  lisinopril.  The Lotrel has an ACE inhibitor as part of this  combination.  I have taken all of her medication bottles and placed the  ones that she  should be taking in one group.   Overall, other than occasional chest pain which seems to be stable, she  has no complaints of shortness of breath, palpitations, dizziness, near  syncope, syncope, orthopnea, PND, or lower extremity edema.  She tells  me that she was called by Cardiac Rehabilitation yesterday and plans on  starting this soon.   PAST MEDICAL HISTORY:  1. Hypertension.  2. Hyperlipidemia.  3. Coronary artery disease with diagnosis established in December      2009, at which time the patient presented with a non-ST-elevation      myocardial infarction to [facility]  As noted above, she      had a drug-eluting stent placed in the first obtuse marginal branch      of the circumflex artery.  4. Gastroesophageal reflux disease.  5. Chronic back pain.   PAST SURGICAL HISTORY:  1. Right shoulder surgery.  2. Appendectomy.   ALLERGIES:  No known drug allergies.   CURRENT MEDICATIONS:  1. Plavix 75 mg once daily.  2. Lisinopril 10 mg once daily.  3.  Metoprolol 25 mg twice daily.  4. Simvastatin 40 mg once daily.  5. Lotrel 10/20 mg once daily (the patient was not discharged home on      Lotrel, but has been taking this as per prior planning.  She also      was not taking a full-strength aspirin as planned in her discharge      paperwork.)   SOCIAL HISTORY:  The patient used tobacco remotely, but currently denies  the use of any tobacco.  She stopped smoking 40 years ago.  She denies  the use of alcohol or illicit drugs.  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   FAMILY HISTORY:  There is no family history of coronary artery disease.  There are several people in the family that had strokes.   REVIEW OF SYSTEMS:  As stated in the history of present illness is  otherwise negative.   PHYSICAL EXAMINATION:  VITALS:  Blood pressure 144/70, pulse 53 and  regular, respirations 12 and unlabored.  GENERAL:  She is a pleasant  elderly African American female in no acute  distress.  She is alert and oriented x3.  SKIN:  Warm and dry.  HEENT:  Normal.  PSYCHIATRIC:  Mood and affect are normal.  MUSCULOSKELETAL:  Muscle strength and tone is normal.  NEUROLOGIC:  No focal neurological deficits.  NECK:  No JVD.  No carotid bruits.  No thyromegaly.  No lymphadenopathy.  LUNGS:  Clear to auscultation bilaterally without wheezes, rhonchi, or  crackles noted.  CARDIOVASCULAR:  Regular rate and rhythm without murmurs, gallops, or  rubs noted.  ABDOMEN:  Soft, nontender, nondistended.  Bowel sounds are present.  EXTREMITIES:  No evidence of edema.  Pulses are 2+ in all extremities.  PELVIC:  The right groin cath site is without hematoma or bruit.   DIAGNOSTIC STUDIES:  1. Left heart catheterization performed on October 27, 2008, showed      that the left main coronary artery had no evidence of disease.  The      LAD is a large vessel that courses to the apex and gives off a      large branching diagonal segment.  There are serial 30% lesions in      the proximal and mid LAD.  There is no obstructive disease in the      LAD or the diagonal branch.  The circumflex artery had 2 large      obtuse marginal branches.  The first obtuse marginal branch was      totally occluded at the beginning portion of the vessel.  The      second obtuse marginal was a moderate-sized vessel that had plaque      disease.  There was mild plaque disease noted in the proximal and      mid circumflex.  The right coronary artery had serial 50% lesions      in the proximal and mid areas.  There was no severe obstructive      disease in the right coronary artery.  Left ventricular ejection      fraction was noted to be normal with no wall motion abnormalities.      Percutaneous coronary intervention was performed with placement of      a 2.75- x 23-mm XIENCE drug-eluting stent in the first obtuse      marginal branch.  The stent was post dilated  with a 3.0- x 20-mm  noncompliant balloon.  There was an excellent angiographic result.  2. A 12-lead EKG obtained in our office today shows sinus bradycardia      with first-degree AV block.  The PR interval was 228 msec.  There      is poor R-wave progression throughout the precordial leads.  There      are nonspecific T-wave abnormalities noted.  3. Fasting lipid profile from October 27, 2008, shows a total      cholesterol of 183, triglycerides 42, HDL cholesterol 47, LDL      cholesterol 128.   ASSESSMENT AND PLAN:  1. Coronary artery disease.  The patient is status post a non-ST-      elevation myocardial infarction in December 2009 with placement of      a drug-eluting stent in the first obtuse marginal branch.  I have      instructed her to begin taking a full-strength aspirin as outlined      in her discharge planning.  As noted above, the patient has not      been taking aspirin as directed.  She has fortunately been taking      her Plavix 75 mg once daily.  We will plan on continuing dual      antiplatelet therapy with aspirin and Plavix for at least 1 year      and possibly longer if the patient tolerates this therapy.  I would      also like to continue her Zocor, Lopressor, and her ACE inhibitor.      In addition, I will start Imdur 30 mg once daily as the patient has      had some mild left-sided chest discomfort which is likely related      to her nonobstructive disease in other vessels.  2. Hyperlipidemia.  The patient had been taking cholesterol medicine      in the past, but prior to her myocardial infarction had stop taking      any statin therapy.  Zocor was restarted in the hospital.  The most      recent fasting lipid profile is outlined above.  We will have her      continue taking Zocor 40 mg once daily, and we will have her return      to our office in 6-8 weeks to have a fasting lipid profile as well      as liver function test.  3. Hypertension.  The  patient has been taking 2 different ACE      inhibitors as outlined above.  We will stop her lisinopril today      and will continue the combination pill Lotrel which consist of      benazepril and amlodipine.  I think the Imdur will help some with      her blood pressure.  If her blood pressure remains elevated on      current therapy, we will adjust accordingly.  4. I have encouraged the patient to continue to follow with her      primary care physician, Dr. Renae Gloss.  I would like to see her back      in my office in 6 months.  The patient is aware that she should      call our office if she has any change in her clinical status in the      meantime.  We have also written out in great detail the medications      that we would like  for her to be taking and have also listed the      medications that should be taken out of her medication bag.  The      patient and her daughter expressed an understanding of this plan.      Verne Carrow, MD  Electronically Signed    CM/MedQ  DD: 11/17/2008  DT: 11/18/2008  Job #: [id number]   cc:   Merlene Laughter. Renae Gloss, M.D.

## 2011-03-29 NOTE — Discharge Summary (Signed)
NAMEMarland Kitchen  Kiara Watkins, Kiara Watkins                 ACCOUNT NO.:  [id number]   MEDICAL RECORD NO.:  [id number]          PATIENT TYPE:  INP   LOCATION:  1415                         FACILITY:  [facility]   PHYSICIAN:  Isidor Holts, M.D.  DATE OF BIRTH:  1927-03-27   DATE OF ADMISSION:  08/04/2006  DATE OF DISCHARGE:  08/06/2006                           DISCHARGE SUMMARY - REFERRING   PMD:  Merlene Laughter. Renae Gloss, M.D.   DISCHARGE DIAGNOSES:  1. Chest pain, rule out coronary artery disease.  2. Syncopal episode secondary to orthostasis from      dehydration/antihypertensive medications.  3. Primary hyperthyroidism.  4. Bradycardia/first degree heart block secondary to beta blocker      treatment.  5. History of hypertension.  6. Dyslipidemia.   DISCHARGE MEDICATIONS:  1. Lotrel (2.5/10) one p.o. daily.  2. Zocor 20 mg p.o. nightly.  3. Apirin 325 mg p.o. daily.   Note, Clonidine and metoprolol have been discontinued secondary to  bradycardia and first degree heart block.  Also Tums has been discontinued  secondary to hypercalcemia.   PROCEDURE:  1. Portable chest x-ray dated July 21, 2006.  This showed prominent      left ventricle and COPD but active disease.  2. Chest CT angiogram dated July 21, 2006.  This showed no evidence      for aortic dissection or focal aneurysm.  No pulmonary emboli. Lung      fields are clear.  3. 2D echocardiogram dated July 21, 2006.  This showed small left      ventricle.  Left ventricular systolic function was vigorous.  EF 65%.      Left ventricular wall thickness was mildly increased.  There was      Doppler evidence for dynamic left ventricular mid cavity obstruction at      rest, with a peak velocity at 3 meters per second and peak gradient of      36 mm mercury.  Left atrium was mildly calcified.  PW Doppler suggests      intracavitary gradient but no turbulence seen on color flow.   CONSULTATIONS:  Dr. Rollene Rotunda, Cardiologist.   ADMISSION HISTORY:  As in H&P of August 04, 2006, dictated by Dr.  Fatima Sanger B. Corky Downs, M.D. However, in brief, this is a 77 year old female,  with known history of hypertension, dyslipidemia, who presented with mid  sternal chest pain.  Apparently, she thought it was gas, utilized Furniture conservator/restorer and Ex-Lax resulting in two bowel movements, the second of which was  watery.  All this occurred on August 03, 2006.  In the a.m. on August 04, 2006 however, she took a single dose of Lotrel (10/20) which had just  been prescribed by her PMD.  Apparently she was on metoprolol prior.  A  couple hours later, she suffered a syncopal episode in the bathroom.  She  was brought to the Emergency Department by her family.  On initial  evaluation she was found to be hypotensive with blood pressure of 88/62, had  a bradycardia of 65 with heart rates ranging between 45 and 67.  EKG  demonstrated first degree heart block.  She was admitted for further  evaluation, investigation and management.   CLINICAL COURSE:  1. Syncopal episode.  The patient had previously been on metoprolol and      presumably because of this, she had developed a significant sinus      bradycardia with associated first degree heart block.  Her medications      were subsequently changed to Lotrel (10/20) which she took for the      first time on August 04, 2006.  Initial laboratory evaluation also      demonstrated a BUN of 16 with a creatinine of 1.  Presumably because of      this combination of factors, i.e. underlying bradycardia,      antihypertensive medication and dehydration, the patient likely      suffered orthostatic hypotension and a syncopal episode.  It is also      possible that there may have been a vasovagal component to her      presentation.  She was monitored telemetrically.  Cardiac enzymes were      cycled and remained unelevated.  EKG showed no acute ischemic changes.      She was volume resuscitated  with intravenous fluid hydration, resulting      in normalization of blood pressure.  On the a.m. of August 05, 2006      the patient was well hydrated and showed no evidence of orthostasis.      BUN and creatinine had normalized to 12 and 0.9 respectively.   1. Chest pain.  The patient has risk factors for coronary artery disease,      including age, hypertension and dyslipidemia.  It is therefore      important to rule out coronary artery disease.  As mentioned above,      cardiac enzymes remained unelevated.  There were no acute ischemic      changes on EKG.  The patient was commenced on Aspirin, and had no      further recurrence of chest pain during hospitalization.  2D      echocardiogram showed no regional wall motion abnormalities and the      patient as a matter of fact did have vigorous left ventricular systolic      function, ejection fraction estimated at 65%.  Cardiology consultation      was called, which was kindly provided by Dr. Rollene Rotunda who has      recommended outpatient stress Cardiolite testing.  This has been      arranged for August 06, 2006.   1. Sinus bradycardia with first degree AV block.  This is deemed likely      secondary to recent beta blocker treatment.  Beta blocker has therefore      been discontinued, as has clonidine.  TSH was normal at 0.665.   1. Dyslipidemia.  Lipid profile was as follows:  Total cholesterol 163,      triglycerides 117, HDL 39, LDL 101.  The patient continued on statin      treatments.  We expect the liver profile to be followed by her primary      M.D., Dr. Andi Devon.  Should the patient turn out to have      coronary artery disease after stress testing, target LDL will have to      be less than 70.   1. Primary hyperparathyroidism.  The patient presents with dehydration,      with  a BUN of 16 and creatinine of 1.  She was found to be     hypercalcemic, with a serum calcium of 10.9 and albumin of 3.5.  She       was managed with intravenous fluid hydration, resulting in satisfactory      diminution of calcium levels to 10.4 on August 04, 2006 and      subsequently to 9.8 on August 05, 2006.  Serum parathyroid hormone      levels were markedly elevated at 195.2 pg/ml against a normal value of      between 14 to 72 pg/ml.  These biochemical findings are consistent with      primary hyperparathyroidism.  Certainly the patient will need an      endocrinology referral and has been recommended to avoid calcium      supplements.   1. Hypertension.  The patient has been normotensive, during the course of      her hospitalization.  Per Dr. Jenene Slicker recommendations, the patient      may be restarted on low dose Lotrel at the time of discharge.   DISPOSITION:  The patient was considered sufficiently clinically stable, on  August 05, 2006.  Provided no acute issues arise overnight, it is  anticipated that she will be discharged in the a.m. of August 06, 2006.  As mentioned in admission history, the patient had a transient episode of  watery stool just prior to presentation.  This was deemed likely secondary  to Ex-Lax and Clarene Reamer, which she took to relieve abdominal cramping and  constipation.  There were no recurrences of liquid stools during  hospitalization.  Work-up will therefore not proceed any further.   DIET:  Healthy heart diet.   DISCHARGE ACTIVITIES:  As tolerated.   WOUND CARE:  No applicable.   PAIN MANAGEMENT:  Not applicable.   FOLLOW-UP INSTRUCTIONS:  The patient is to follow-up with her primary M.D.  Dr. Andi Devon in one to two weeks.  She has been instructed to call  for an appointment.  She is also to follow-up with Dr. Rollene Rotunda,  Cardiology on August 19, 2006 at 2:45 p.m.  Telephone number [phone].   SPECIAL INSTRUCTIONS:  1. Stress Cardiolite testing has been scheduled for August 06, 2006 at      Dr. Fayrene Fearing Hochrein's office.  Arrangements  have already been put in      place.  2. We strongly recommend that primary M.D., Dr. Andi Devon refer      patient for an endocrinologic consultation for further management of      her primary hyperparathyroidism.      Isidor Holts, M.D.  Electronically Signed     CO/MEDQ  D:  08/05/2006  T:  08/06/2006  Job:  [id number]   cc:   Merlene Laughter. Renae Gloss, M.D.  Fax: [phone]   Rollene Rotunda, MD, [facility]  [address]  [address]  [place]  [place] [address]

## 2011-03-29 NOTE — Consult Note (Signed)
NAME:  Kiara Watkins, Kiara Watkins                 ACCOUNT NO.:  [id number]   MEDICAL RECORD NO.:  [id number]          PATIENT TYPE:  INP   LOCATION:  1415                         [facility]ACILITY:  [facility]   PHYSICIAN:  Rollene Rotunda, MD, FACCDATE OF BIRTH:  20-Nov-1926   DATE OF CONSULTATION:  08/05/2006  DATE OF DISCHARGE:                                   CONSULTATION   PRIMARY CARE PHYSICIAN:  Merlene Laughter. Renae Gloss, M.D.   REASON FOR PRESENTATION:  Evaluate patient with chest discomfort and  presyncope.   HISTORY OF PRESENT ILLNESS:  The patient is a very lovely 77 year old  African-American female without prior cardiac history with cardiovascular  risk factors.  She was in her usual state of health until yesterday.  She  had some abdominal discomfort the night before.  Yesterday morning, she had  2 bowel movements.  She then felt she would clean her bowels out.  She  took 2 Ex-Lax.  She had diarrhea following this.  She then was standing and  developed lightheadedness and profuse diaphoresis.  She never lost  consciousness.  She did not notice palpitations or chest pain at that time.  She was not particularly short of breath.  She had no PND or orthopnea.  EMS  was called, and it was not clear to me what they found.  She was driven by  private to the emergency room where she was noted to be bradycardic with  occasional rates in the 40s.  Her blood pressure initially was 88/62.  Point  of care markers were negative.  EKG demonstrated no acute ST-T wave changes.  She did have some nonspecific lateral T wave flattening with slight  inversion.  There were no old EKGs for comparison.  The patient was weak at  the time of presentation, but has since improved.  Of note, the patient did  switch from Lotrel to a generic, taking the first dose yesterday morning.   She otherwise says she is active, taking care of her home.  With this, she  does things like vacuum and does not describe any chest discomfort, neck  discomfort or arm discomfort.  She has no significant dyspnea, and denies  any PND or orthopnea.   PAST MEDICAL HISTORY:  1. Recent dyslipidemia.  2. Hypertension times years.   PAST SURGICAL HISTORY:  Appendectomy.   ALLERGIES:  None.   MEDICATIONS (AT HOME):  1. Naproxen sodium.  2. Zocor 20 mg q.h.s.  3. Amlodipine/benazepril 10/20.  4. Clonidine 0.2 mg daily.   SOCIAL HISTORY:  The patient lives in [place].  She is retired.  She had  16 children; currently, has only 9 living.  Seven died after being born  premature.  She is retired from [employer].  She quit smoking 30 years  ago.   FAMILY HISTORY:  Noncontributory for early coronary artery disease.   REVIEW OF SYSTEMS:  As stated in the HPI.  Now, she is positive for chest  discomfort with inspiration or certain movement since being admitted.  Otherwise, negative for other systems.   PHYSICAL EXAMINATION:  GENERAL:  The  patient was well-appearing in no  distress.  VITAL SIGNS:  Blood pressure 147/67, heart rate 68 without any drop with  standing, heart rate 50s, afebrile.  HEENT:  Eyes unremarkable.  Pupils equal, round and reactive to light.  Fundi not visualized.  Oral mucosa unremarkable.  NECK:  No jugular venous distention.  Waveform within normal limits.  Carotid upstroke brisk and symmetric.  No bruits, no thyromegaly.  LYMPHATICS:  No cervical, axillary or inguinal adenopathy.  LUNGS:  Clear to auscultation bilaterally.  BACK:  No costovertebral angle tenderness.  CHEST:  Unremarkable.  HEART:  PMI not displaced or sustained.  S1 and S2 within normal limits.  No  S3, no S4, no murmurs.  ABDOMEN:  Flat.  Positive bowel sounds.  Normal in frequency and pitch.  No  bruits, rebound, guarding.  No midline pulse.  No mass, hepatomegaly or  splenomegaly.  SKIN:  No rashes or nodules.  EXTREMITIES:  There were 2+ pulses throughout.  No edema, no cyanosis or  clubbing.  NEUROLOGIC:  Oriented to person,  place, and time.  Cranial nerves II-XII  grossly intact.  Motor grossly intact.   ELECTROCARDIOGRAM:  As stated above.   LABORATORY DATA:  WBC was 6, hemoglobin 12.5, platelets 216.  Sodium 139,  potassium 4.1, BUN 12, creatinine 0.9.  CK-MB and troponin were negative x2.   CHEST X-RAY:  Prominent LV.  COPD.  In no acute distress.   CHEST CT:  No aortic dissection, no pulmonary embolism.  Lungs clear.   IMPRESSION:  1. Presyncope.  The patient had an episode of presyncope that was probably      a vagal reaction.  She has since had some chest discomfort.  She has      significant cardiovascular risk factors.  There is no objective      evidence of ischemia.  At this point, I think there is a somewhat low      probability of obstructive coronary disease as an etiology.  Because of      her age and risk factors, I will screen her.  She should be able to      walk on a treadmill, so I will do an exercise Cardiolite.  I have      discussed this with her, and she agrees to proceed.  2. Bradycardia.  She is periodically having heart rates dip into the 40s      and 50s.  She is not currently having any symptoms related to this.  I      would discontinue the clonidine permanently.  3. Hypertension.  Blood pressure is now creeping back up.  I think she      probably should be discharged on a half      dose of her amlodipine/benazepril.  This could be followed by Dr.      Renae Gloss going forward.  4. Followup.  I will see her after the stress perfusion study and judge      her heart rate.  She may need a 24-hour monitor or event monitor if she      has symptoms of continued bradycardia.           ______________________________  Rollene Rotunda, MD, [facility]     JH/MEDQ  D:  08/05/2006  T:  08/07/2006  Job:  [id number]   cc:   Merlene Laughter. Renae Gloss, M.D.  Fax: [phone]

## 2011-03-29 NOTE — H&P (Signed)
NAME:  Kiara Watkins, Kiara Watkins                 ACCOUNT NO.:  [id number]   MEDICAL RECORD NO.:  [id number]          PATIENT TYPE:  EMS   LOCATION:  ED                           [facility]ACILITY[facility]  [facility]   PHYSICIAN:  Mobolaji B. Bakare, M.D.DATE OF BIRTH:  1927-02-10   DATE OF ADMISSION:  08/04/2006  DATE OF DISCHARGE:                                HISTORY & PHYSICAL   CHIEF COMPLAINT:  1. Chest pain last night.  2. Passed out this morning.   HISTORY OF PRESENTING COMPLAINT:  Kiara Watkins is a pleasant 77 year old  African-American female who is under the care of Dr. Andi Devon for  hyperlipidemia and hypertension. She was in her usual state of health until  about 3:00 a.m. last night when she had midsternal chest pain which was  pressure like in nature radiating to her back, rated as 5 over 10. She  thought it was gas. She ended up using Alka-Seltzer. This chest pain was  actually preceded by abdominal cramps and discomfort. She used Ex-Lax to  relieve these cramps. She felt it was constipation. The patient moved her  bowels twice and there after second bowel movement was watery.  The chest  pressure did not completely go away. She did not have associated nausea,  vomiting or diaphoresis at onset.   This morning about 10:00 a.m. she used a new medication for the first time  which is a combination of amlodipine 10 mg and benazepril 20 mg. This was  recently changed by Dr. Renae Gloss. I believe she was on metoprolol prior to  this (according to E-chart record but the patient cannot remember the  previous medication).  Approximately 12:00 noon she went to the bathroom and  she passed out. She was clammy, and hence, the patient was brought to the  emergency room by family. The patient had an episode of chest pressure  similar to this about one week ago but she got relief from Tums.   On arrival she had a temperature of 98.6, blood pressure of 88/62, pulse of  65.  Heart rate has been ranging between  45 and 67, in first-degree AV  block. The patient currently denies any more chest pain and there is no  diaphoresis. She has responded quite nicely to IV fluids. Blood pressure is  129/66.   EKG showed first-degree AV block with some T wave inversions in the lateral  leads. She has Q wave in lead III.  PR interval 208 msec.  She had a CT  angiogram of the chest which was unremarkable for dissection or pulmonary  embolism.  The lung parenchyma were clear.  Cardiac markers at this point  have been negative so far.   The patient was noted to have elevated calcium of 10.9 and alkaline  phosphatase of 124 on initial laboratory data. These two are slightly  elevated. She frequently uses Tums.  The patient has not had a mammogram  recently. She denies any weight loss. There is no back pain.   REVIEW OF SYSTEMS:  The patient denies cough. No headaches. No weight loss.  No nausea  or vomiting. She has been pretty much active at home.   PAST MEDICAL HISTORY:  1. Hyperlipidemia.  2. Hypertension.   PAST SURGICAL HISTORY:  None.   CURRENT MEDICATIONS:  1. Naproxen 500 mg twice a day.  2. Zocor 20 mg once a day.  3. Amlodipine/benazepril 10/20 one p.o. daily which she started today.  4. Clonidine 0.2 mg daily.   ALLERGIES:  No known drug allergies.   FAMILY HISTORY:  The patient's father passed away from cancer which she does  not what type.  Mother passed away from stomach problems. The patient's  husband is deceased. She has nine children and many grandchildren.   SOCIAL HISTORY:  The patient has been a homemaker. She is currently living  with grandchildren, taking care of them and she is independent with  activities of daily living.  The patient does not drink alcohol.  She has a  20 to 25 pack years of smoking. She quit 35 years ago.   PHYSICAL EXAMINATION:  GENERAL:  On examination she is not ill looking, not  in respiratory distress. Mucous membranes moist. No thrush.  HEENT:   Normocephalic, atraumatic.  Pupils are equal, round, and reactive to  light. Extraocular muscle movements are intact.  LUNGS:  Clear to auscultation.  CVS:  S1 and S2. Regular. No murmur, no gallop.  ABDOMEN:  Nondistended and nontender. Bowel sounds present. No palpable  organomegaly.  EXTREMITIES:  No pedal edema. No calf tenderness.  NEUROLOGIC:  There are no focal neurological deficits.  SKIN:  No rash, no petechiae.   INITIAL LABORATORY DATA:  White cells 6.3, hemoglobin 12.5, hematocrit 37.4,  platelets 216.  Neutrophils are 73%.  Sodium 140, potassium 3.7, chloride  109, CO2 24, glucose 134, BUN 16, creatinine 1, calcium 10.9, protein 7,  albumin 3.5, AST 23, ALT 17, alkaline phosphatase 124, bilirubin 4.8.  Cardiac markers at the point of care x2 sets have been negative so far.  EKG: As mentioned in the HPI.  CT scan of the chest as mentioned in the HPI.  Chest x-ray shows evidence of COPD.   ASSESSMENT AND PLAN:  Kiara Watkins is a 77 year old African-American female  presenting with chest pain, hypertension, bradycardia with first-degree AV  block and hypercalcemia. She will be admitted to telemetry for further  evaluation.  1. Chest pain. We need to rule out myocardial infarction. Although this is      atypical, will need to rule out myocardial infarction.  Will cycle      cardiac enzymes. Check fasting lipid profile, hemoglobin A1c. Start on      aspirin 325 mg p.o. daily. Obtain old EKG from Dr. Mathews Robinsons office.      Will give Protonix 40 mg daily and Mylanta p.r.n. for dyspepsia.  If      she rules out, may benefit from an outpatient stress test.  2. Hypotension. This is likely secondary to new  medication, Lotrel with      overlying diarrhea.  She has responded nicely to IV fluid. Will hold      Capoten 6.25.  If diarrhea persists, will check stool studies. 1. Syncope, likely related to hypotension, as mentioned above. Will check      orthostatic blood pressure, hydrate,  monitor on telemetry and cycle      cardiac enzymes.  2. Mild hypercalcemia with elevated alkaline phosphatase. The etiology is      unclear. Will check calcium. Will repeat calcium, magnesium and  alkaline phosphatase. Check intact PTH and TSH. Check urinalysis.      Hydrate with normal saline at 150 mL per hour x2 L. Repeat BMET in a.m.  3. Bradycardia/first degree AV block. The patient appears to have been on      metoprolol but I cannot confirm this. Will monitor on telemetry      presently.  She is asymptomatic. Will repeat EKG in the morning.  4. Hyperlipidemia.  Continue with Zocor.      Mobolaji B. Corky Downs, M.D.  Electronically Signed     MBB/MEDQ  D:  08/04/2006  T:  08/04/2006  Job:  [id number]   cc:   Merlene Laughter. Renae Gloss, M.D.  Fax: [phone]

## 2011-04-10 ENCOUNTER — Encounter: Payer: Self-pay | Admitting: Cardiovascular Disease

## 2011-04-22 ENCOUNTER — Ambulatory Visit (INDEPENDENT_AMBULATORY_CARE_PROVIDER_SITE_OTHER): Payer: Medicare Other | Admitting: Cardiovascular Disease

## 2011-04-22 ENCOUNTER — Encounter: Payer: Self-pay | Admitting: Cardiovascular Disease

## 2011-04-22 VITALS — BP 134/78 | HR 59 | Resp 18 | Ht 64.0 in | Wt 159.8 lb

## 2011-04-22 DIAGNOSIS — E782 Mixed hyperlipidemia: Secondary | ICD-10-CM

## 2011-04-22 DIAGNOSIS — I2510 Atherosclerotic heart disease of native coronary artery without angina pectoris: Secondary | ICD-10-CM

## 2011-04-22 NOTE — Progress Notes (Signed)
History of Present Illness:Kiara Watkins is a pleasant 77 year old African American female with a past medical history significant for hypertension, hyperlipidemia, and coronary artery disease who presented to [facility] in December 2009 with a non-ST elevation myocardial infarction.  Left heart catheterization on October 27, 2008, showed a totally occluded first obtuse marginal branch and otherwise nonobstructive disease with normal LV function. The patient was treated with a 2.75- x 23-mm XIENCE drug-eluting stent.  She has been doing well, no chest pain, shortness of breath, near syncope, syncope, lower ext edema. She does have diffuse joint aches. Lipids followed in primary care.   Primary care is Dr. Andi Devon.  Past Medical History  Diagnosis Date  . CAD (coronary artery disease) 12/09    CAD status post successful percutanous coronary intervention and steneting of the first obtuse marginal with placement of a 2.75 x 23 mm XIENCE V drug-eluting   . HTN (hypertension)   . Hyperlipidemia   . GERD (gastroesophageal reflux disease)   . Chronic back pain     No past surgical history on file.  Current Outpatient Prescriptions  Medication Sig Dispense Refill  . acetaminophen (TYLENOL) 500 MG tablet Take 500 mg by mouth daily.        Marland Kitchen amLODipine-benazepril (LOTREL) 10-20 MG per capsule Take 1 capsule by mouth daily.        Marland Kitchen aspirin 81 MG tablet Take 81 mg by mouth daily.        . calcium-vitamin D (OSCAL WITH D) 500-200 MG-UNIT per tablet Take 1 tablet by mouth daily.        . metoprolol tartrate (LOPRESSOR) 25 MG tablet Take 25 mg by mouth daily.        . nitroGLYCERIN (NITROSTAT) 0.4 MG SL tablet Place 0.4 mg under the tongue every 5 (five) minutes as needed.        . simvastatin (ZOCOR) 40 MG tablet Take 40 mg by mouth at bedtime.          No Known Allergies  History   Social History  . Marital Status: Widowed    Spouse Name: N/A    Number of Children: N/A  . Years  of Education: N/A   Occupational History  . Not on file.   Social History Main Topics  . Smoking status: Former Games developer  . Smokeless tobacco: Not on file   Comment: quit approximately 30 years ao   . Alcohol Use: No     quit approximately 30 years ago   . Drug Use: Not on file  . Sexually Active: Not on file   Other Topics Concern  . Not on file   Social History Narrative   Lives in [place] and cares for her 9 great-great-great grandchildren. Has a total of 56 grand, great grand and great great grandchildren. Has 9 children who are alive and well.      Family History  Problem Relation Age of Onset  . Coronary artery disease Neg Hx     Review of Systems:  As stated in the HPI and otherwise negative.   BP 134/78  Pulse 59  Resp 18  Ht 5\' 4"  (1.626 m)  Wt 159 lb 12.8 oz (72.485 kg)  BMI 27.43 kg/m2  Physical Examination: General: Well developed, well nourished, NAD HEENT: OP clear, mucus membranes moist SKIN: warm, dry. No rashes. Neuro: No focal deficits Musculoskeletal: Muscle strength 5/5 all ext Psychiatric: Mood and affect normal Neck: No JVD, no carotid bruits, no thyromegaly, no  lymphadenopathy. Lungs:Clear bilaterally, no wheezes, rhonci, crackles Cardiovascular: Regular rate and rhythm. No murmurs, gallops or rubs. Abdomen:Soft. Bowel sounds present. Non-tender.  Extremities: No lower extremity edema. Pulses are 2 + in the bilateral DP/PT.  UEA:VWUJW brady, rate 59 bpm. 1st degree AV block. PAC. Non-specific T wave abnormality

## 2011-04-22 NOTE — Assessment & Plan Note (Signed)
Followed in primary care. I have asked her to hold Zocor for several weeks to see if her joint aches resolve. Most likely related to OA. If no resolution, will resume Zocor.

## 2011-04-22 NOTE — Assessment & Plan Note (Signed)
Stable No changes 

## 2011-04-22 NOTE — Patient Instructions (Signed)
Your physician recommends that you schedule a follow-up appointment in: 6 months. The office will mail you a reminder letter 2 months prior appointment date.

## 2011-08-05 LAB — URINALYSIS, ROUTINE W REFLEX MICROSCOPIC
Glucose, UA: NEGATIVE
Ketones, ur: NEGATIVE
Protein, ur: NEGATIVE
Urobilinogen, UA: 1

## 2011-08-05 LAB — URINE MICROSCOPIC-ADD ON

## 2011-08-06 LAB — BASIC METABOLIC PANEL
BUN: 16
CO2: 23
Calcium: 11.2 — ABNORMAL HIGH
Chloride: 102
Creatinine, Ser: 1.07

## 2011-08-06 LAB — URINALYSIS, ROUTINE W REFLEX MICROSCOPIC
Glucose, UA: 100 — AB
Leukocytes, UA: NEGATIVE
Protein, ur: NEGATIVE
Specific Gravity, Urine: 1.015
Urobilinogen, UA: 0.2

## 2011-08-06 LAB — HEPATIC FUNCTION PANEL
ALT: 41 — ABNORMAL HIGH
AST: 48 — ABNORMAL HIGH
Alkaline Phosphatase: 169 — ABNORMAL HIGH
Bilirubin, Direct: 0.2
Indirect Bilirubin: 1 — ABNORMAL HIGH
Total Bilirubin: 1.2

## 2011-08-06 LAB — DIFFERENTIAL
Basophils Relative: 0
Eosinophils Absolute: 0
Eosinophils Relative: 0
Lymphs Abs: 0.7
Monocytes Absolute: 0.1
Monocytes Relative: 1 — ABNORMAL LOW
Neutrophils Relative %: 91 — ABNORMAL HIGH

## 2011-08-06 LAB — URINE MICROSCOPIC-ADD ON

## 2011-08-06 LAB — CBC
HCT: 46
MCHC: 33.9
MCV: 83.8
RBC: 5.5 — ABNORMAL HIGH
WBC: 9.7

## 2011-08-16 LAB — DIFFERENTIAL
Basophils Absolute: 0 10*3/uL (ref 0.0–0.1)
Basophils Relative: 1 % (ref 0–1)
Lymphocytes Relative: 25 % (ref 12–46)
Lymphocytes Relative: 40 % (ref 12–46)
Lymphs Abs: 2.2 10*3/uL (ref 0.7–4.0)
Monocytes Absolute: 0.3 10*3/uL (ref 0.1–1.0)
Monocytes Relative: 8 % (ref 3–12)
Neutro Abs: 4.5 10*3/uL (ref 1.7–7.7)
Neutro Abs: 2.8 10*3/uL (ref 1.7–7.7)
Neutrophils Relative %: 69 % (ref 43–77)
Neutrophils Relative %: 50 % (ref 43–77)

## 2011-08-16 LAB — BASIC METABOLIC PANEL
BUN: 7 mg/dL (ref 6–23)
Calcium: 9.5 mg/dL (ref 8.4–10.5)
Chloride: 107 mEq/L (ref 96–112)
Creatinine, Ser: 0.81 mg/dL (ref 0.4–1.2)
GFR calc Af Amer: >60 mL/min (ref >60)
GFR calc non Af Amer: 57 mL/min — ABNORMAL LOW (ref >60)
Potassium: 3.4 mEq/L — ABNORMAL LOW (ref 3.5–5.1)

## 2011-08-16 LAB — PROTIME-INR: INR: 1.1 (ref 0.00–1.49)

## 2011-08-16 LAB — CARDIAC PANEL(CRET KIN+CKTOT+MB+TROPI)
CK, MB: 170.9 ng/mL — ABNORMAL HIGH (ref 0.3–4.0)
CK, MB: 88.8 ng/mL — ABNORMAL HIGH (ref 0.3–4.0)
Relative Index: 12.4 — ABNORMAL HIGH (ref 0.0–2.5)
Relative Index: 15.3 — ABNORMAL HIGH (ref 0.0–2.5)
Total CK: 1116 U/L — ABNORMAL HIGH (ref 7–177)

## 2011-08-16 LAB — HEPARIN LEVEL (UNFRACTIONATED)
Heparin Unfractionated: <0.1 IU/mL — ABNORMAL LOW (ref 0.30–0.70)
Heparin Unfractionated: <0.1 IU/mL — ABNORMAL LOW (ref 0.30–0.70)

## 2011-08-16 LAB — COMPREHENSIVE METABOLIC PANEL
ALT: 17 U/L (ref 0–35)
Alkaline Phosphatase: 134 U/L — ABNORMAL HIGH (ref 39–117)
CO2: 23 mEq/L (ref 19–32)
Chloride: 109 mEq/L (ref 96–112)
GFR calc non Af Amer: >60 mL/min (ref >60)
Glucose, Bld: 110 mg/dL — ABNORMAL HIGH (ref 70–99)
Potassium: 3.8 mEq/L (ref 3.5–5.1)
Sodium: 140 mEq/L (ref 135–145)
Total Bilirubin: 0.4 mg/dL (ref 0.3–1.2)

## 2011-08-16 LAB — LIPID PANEL
Cholesterol: 183 mg/dL (ref 0–200)
Total CHOL/HDL Ratio: 3.9 RATIO

## 2011-08-16 LAB — POCT I-STAT, CHEM 8
Calcium, Ion: 1.2 mmol/L (ref 1.12–1.32)
Chloride: 109 mEq/L (ref 96–112)
Glucose, Bld: 106 mg/dL — ABNORMAL HIGH (ref 70–99)
HCT: 45 % (ref 36.0–46.0)
TCO2: 23 mmol/L (ref 0–100)

## 2011-08-16 LAB — CBC
Hemoglobin: 13.6 g/dL (ref 12.0–15.0)
Hemoglobin: 11.8 g/dL — ABNORMAL LOW (ref 12.0–15.0)
MCHC: 31.7 g/dL (ref 30.0–36.0)
MCV: 84 fL (ref 78.0–100.0)
MCV: 85.4 fL (ref 78.0–100.0)
Platelets: 231 10*3/uL (ref 150–400)
Platelets: 178 10*3/uL (ref 150–400)
RBC: 3.93 MIL/uL (ref 3.87–5.11)
RBC: 4.3 MIL/uL (ref 3.87–5.11)
RDW: 14.9 % (ref 11.5–15.5)
WBC: 5.6 10*3/uL (ref 4.0–10.5)
WBC: 5.8 10*3/uL (ref 4.0–10.5)

## 2011-08-16 LAB — CK TOTAL AND CKMB (NOT AT ARMC)
Relative Index: 1.1 (ref 0.0–2.5)
Total CK: 309 U/L — ABNORMAL HIGH (ref 7–177)

## 2011-08-16 LAB — POCT CARDIAC MARKERS
CKMB, poc: 4.9 ng/mL (ref 1.0–8.0)
Troponin i, poc: 0.58 ng/mL (ref 0.00–0.09)
Troponin i, poc: 0.87 ng/mL (ref 0.00–0.09)

## 2011-08-16 LAB — GLUCOSE, CAPILLARY: Glucose-Capillary: 111 mg/dL — ABNORMAL HIGH (ref 70–99)

## 2011-10-24 ENCOUNTER — Encounter: Payer: Self-pay | Admitting: Cardiovascular Disease

## 2011-10-30 ENCOUNTER — Encounter: Payer: Self-pay | Admitting: Cardiovascular Disease

## 2011-10-30 ENCOUNTER — Ambulatory Visit (INDEPENDENT_AMBULATORY_CARE_PROVIDER_SITE_OTHER): Payer: Medicare Other | Admitting: Cardiovascular Disease

## 2011-10-30 VITALS — BP 148/76 | HR 87 | Ht 65.0 in | Wt 153.0 lb

## 2011-10-30 DIAGNOSIS — I2510 Atherosclerotic heart disease of native coronary artery without angina pectoris: Secondary | ICD-10-CM

## 2011-10-30 NOTE — Progress Notes (Signed)
History of Present Illness: Kiara Watkins is a pleasant 77 year old African American female with a past medical history significant for hypertension, hyperlipidemia, and coronary artery disease who presented to [facility] in December 2009 with a non-ST elevation myocardial infarction. Left heart catheterization on October 27, 2008, showed a totally occluded first obtuse marginal branch and otherwise nonobstructive disease with normal LV function. The patient was treated with a 2.75- x 23-mm XIENCE drug-eluting stent. I last saw her in June 2012.   She tells me today that she has noticed some substernal chest pain, described as aching. This pain lasts for several minutes. She notices this mostly at rest. This occurs every day. No associated SOB/diaphoresis. No shortness of breath, near syncope, syncope, lower ext edema.   Primary care is Dr. Andi Devon. Lipids followed in primary care.     Past Medical History  Diagnosis Date  . CAD (coronary artery disease) 12/09    CAD status post successful percutanous coronary intervention and steneting of the first obtuse marginal with placement of a 2.75 x 23 mm XIENCE V drug-eluting   . HTN (hypertension)   . Hyperlipidemia   . GERD (gastroesophageal reflux disease)   . Chronic back pain     No past surgical history on file.  Current Outpatient Prescriptions  Medication Sig Dispense Refill  . acetaminophen (TYLENOL) 500 MG tablet Take 500 mg by mouth daily.        Marland Kitchen amLODipine-benazepril (LOTREL) 10-20 MG per capsule Take 1 capsule by mouth daily.        Marland Kitchen aspirin 81 MG tablet Take 81 mg by mouth daily.        . calcium-vitamin D (OSCAL WITH D) 500-200 MG-UNIT per tablet Take 1 tablet by mouth daily.        . metoprolol tartrate (LOPRESSOR) 25 MG tablet Take 25 mg by mouth daily.        . nitroGLYCERIN (NITROSTAT) 0.4 MG SL tablet Place 0.4 mg under the tongue every 5 (five) minutes as needed.        . simvastatin (ZOCOR) 40 MG  tablet Take 40 mg by mouth at bedtime.          No Known Allergies  History   Social History  . Marital Status: Widowed    Spouse Name: N/A    Number of Children: N/A  . Years of Education: N/A   Occupational History  . Not on file.   Social History Main Topics  . Smoking status: Former Games developer  . Smokeless tobacco: Not on file   Comment: quit approximately 30 years ao   . Alcohol Use: No     quit approximately 30 years ago   . Drug Use: Not on file  . Sexually Active: Not on file   Other Topics Concern  . Not on file   Social History Narrative   Lives in [place] and cares for her 9 great-great-great grandchildren. Has a total of 56 grand, great grand and great great grandchildren. Has 9 children who are alive and well.      Family History  Problem Relation Age of Onset  . Coronary artery disease Neg Hx     Review of Systems:  As stated in the HPI and otherwise negative.   BP 148/76  Pulse 87  Ht 5\' 5"  (1.651 m)  Wt 153 lb (69.4 kg)  BMI 25.46 kg/m2  Physical Examination: General: Well developed, well nourished, NAD HEENT: OP clear, mucus membranes moist SKIN:  warm, dry. No rashes. Neuro: No focal deficits Musculoskeletal: Muscle strength 5/5 all ext Psychiatric: Mood and affect normal Neck: No JVD, no carotid bruits, no thyromegaly, no lymphadenopathy. Lungs:Clear bilaterally, no wheezes, rhonci, crackles Cardiovascular: Regular rate and rhythm. No murmurs, gallops or rubs. Abdomen:Soft. Bowel sounds present. Non-tender.  Extremities: No lower extremity edema. Pulses are 2 + in the bilateral DP/PT.  EKG: sinus rhythm, 1st degree AV block.

## 2011-10-30 NOTE — Patient Instructions (Signed)
Your physician recommends that you schedule a follow-up appointment in: 3-4 weeks.   Your physician has requested that you have a lexiscan myoview. For further information please visit [email or web address] Please follow instruction sheet, as given.

## 2011-10-30 NOTE — Assessment & Plan Note (Addendum)
Recent chest pain with typical and atypical features. DES in December 2009 with no recent ischemic workup since then. The pain is occurring daily. Will begin ischemic workup with Lexiscan stress myoview. She cannot walk on a treadmill because of joint pains.

## 2011-11-14 ENCOUNTER — Other Ambulatory Visit (HOSPITAL_COMMUNITY): Payer: Medicare Other | Admitting: Radiology

## 2011-11-26 ENCOUNTER — Ambulatory Visit (HOSPITAL_COMMUNITY): Payer: Medicare Other | Attending: Cardiovascular Disease | Admitting: Radiology

## 2011-11-26 VITALS — BP 132/73 | Ht 65.0 in | Wt 152.0 lb

## 2011-11-26 DIAGNOSIS — Z87891 Personal history of nicotine dependence: Secondary | ICD-10-CM | POA: Insufficient documentation

## 2011-11-26 DIAGNOSIS — I2510 Atherosclerotic heart disease of native coronary artery without angina pectoris: Secondary | ICD-10-CM | POA: Insufficient documentation

## 2011-11-26 DIAGNOSIS — R0609 Other forms of dyspnea: Secondary | ICD-10-CM | POA: Insufficient documentation

## 2011-11-26 DIAGNOSIS — R0989 Other specified symptoms and signs involving the circulatory and respiratory systems: Secondary | ICD-10-CM | POA: Insufficient documentation

## 2011-11-26 DIAGNOSIS — I252 Old myocardial infarction: Secondary | ICD-10-CM | POA: Insufficient documentation

## 2011-11-26 DIAGNOSIS — R079 Chest pain, unspecified: Secondary | ICD-10-CM | POA: Insufficient documentation

## 2011-11-26 DIAGNOSIS — E785 Hyperlipidemia, unspecified: Secondary | ICD-10-CM | POA: Insufficient documentation

## 2011-11-26 DIAGNOSIS — R42 Dizziness and giddiness: Secondary | ICD-10-CM | POA: Insufficient documentation

## 2011-11-26 DIAGNOSIS — I10 Essential (primary) hypertension: Secondary | ICD-10-CM | POA: Insufficient documentation

## 2011-11-26 MED ORDER — TECHNETIUM TC 99M TETROFOSMIN IV KIT
11.0000 | PACK | Freq: Once | INTRAVENOUS | Status: AC | PRN
  Administered 2011-11-26: 11 via INTRAVENOUS

## 2011-11-26 MED ORDER — TECHNETIUM TC 99M TETROFOSMIN IV KIT
33.0000 | PACK | Freq: Once | INTRAVENOUS | Status: AC | PRN
  Administered 2011-11-26: 33 via INTRAVENOUS

## 2011-11-26 MED ORDER — REGADENOSON 0.4 MG/5ML IV SOLN
0.4000 mg | Freq: Once | INTRAVENOUS | Status: AC
  Administered 2011-11-26: 0.4 mg via INTRAVENOUS

## 2011-11-26 NOTE — Progress Notes (Signed)
[facility] SITE 3 NUCLEAR MED [address] [place] [address] [phone]  Cardiology Nuclear Med Study  CHRISHANA SPARGUR is a 78 y.o. female [id number] [birth year]   Nuclear Med Background Indication for Stress Test:  Evaluation for Ischemia and Stent Patency History: 2/11' Echo(EF65%),10/27/08- Heart Catheterization(NL LVF totally occuded oMI,n/o CAD (stent), 12/09 Myocardial Infarction(NSTEMI) EF%,07' Myocardial Perfusion Study)09'(- ischemia(-)scar and12/17/09- Stents(om(cfx) Cardiac Risk Factors: History of Smoking, Hypertension and Lipids  Symptoms:  Chest Pain-aching qdx78months, Dizziness and DOE   Nuclear Pre-Procedure Caffeine/Decaff Intake:  None NPO After: 8:30pm   Lungs:  CLEAR IV 0.9% NS with Angio Cath:  22g  IV Site: L Forearm  IV Started by:  Cathlyn Parsons, RN  Chest Size (in):  40 Cup Size: B  Height: 5\' 5"  (1.651 m)  Weight:  152 lb (68.947 kg)  BMI:  Body mass index is 25.29 kg/(m^2). Tech Comments:  Metoprolol not taken in a month    Nuclear Med Study 1 or 2 day study: 1 day  Stress Test Type:  Lexiscan  Reading MD: Charlton Haws, MD  Order Authorizing Provider:  Tedra Senegal  Resting Radionuclide: Technetium 86m Tetrofosmin  Resting Radionuclide Dose: 11 mCi   Stress Radionuclide:  Technetium 55m Tetrofosmin  Stress Radionuclide Dose: 33 mCi           Stress Protocol Rest HR: 51 Stress HR: 120  Rest BP: 132/73 Stress BP:167/65   Exercise Time (min): n/a METS: n/a   Predicted Max HR: 138 bpm % Max HR: 86.96 bpm Rate Pressure Product: 13244   Dose of Adenosine (mg):  n/a Dose of Lexiscan: 0.4 mg  Dose of Atropine (mg): n/a Dose of Dobutamine: n/a mcg/kg/min (at max HR)  Stress Test Technologist: Frederick Peers, EMT-P  Nuclear Technologist:  Doyne Keel, CNMT     Rest Procedure:  Myocardial perfusion imaging was performed at rest 45 minutes following the intravenous administration of Technetium 63m  Tetrofosmin. Rest ECG: Sinus Brady 1AVB with occ PVC  Stress Procedure:  The patient received IV Lexiscan 0.4 mg over 15-seconds.  Technetium 83m Tetrofosmin injected at 30-seconds.  There were no significant changes with Lexiscan.  Quantitative spect images were obtained after a 45 minute delay. Stress ECG: No significant change from baseline ECG  QPS Raw Data Images:  Normal; no motion artifact; normal heart/lung ratio. Stress Images:  Normal homogeneous uptake in all areas of the myocardium. Rest Images:  Normal homogeneous uptake in all areas of the myocardium. Subtraction (SDS):  Normal Transient Ischemic Dilatation (Normal <1.22):  .92 Lung/Heart Ratio (Normal <0.45):  .30  Quantitative Gated Spect Images QGS EDV:  64 ml QGS ESV:  13 ml QGS cine images:  NL LV Function; NL Wall Motion QGS EF: 80%  Impression Exercise Capacity:  Lexiscan with low level exercise. BP Response:  Normal blood pressure response. Clinical Symptoms:  There is dyspnea. ECG Impression:  No significant ST segment change suggestive of ischemia. Comparison with Prior Nuclear Study: No images to compare  Overall Impression:  Normal stress nuclear study.     Charlton Haws

## 2011-11-28 ENCOUNTER — Ambulatory Visit: Payer: Medicare Other | Admitting: Cardiovascular Disease

## 2011-12-30 ENCOUNTER — Other Ambulatory Visit: Payer: Self-pay | Admitting: Cardiovascular Disease

## 2012-06-06 ENCOUNTER — Emergency Department (HOSPITAL_COMMUNITY): Payer: Medicare Other

## 2012-06-06 ENCOUNTER — Encounter (HOSPITAL_COMMUNITY): Payer: Self-pay | Admitting: *Deleted

## 2012-06-06 ENCOUNTER — Emergency Department (HOSPITAL_COMMUNITY)
Admission: EM | Admit: 2012-06-06 | Discharge: 2012-06-07 | Disposition: A | Discharge status: Home / Self Care | Payer: Medicare Other | Attending: Emergency Medicine | Admitting: Emergency Medicine

## 2012-06-06 DIAGNOSIS — I10 Essential (primary) hypertension: Secondary | ICD-10-CM | POA: Insufficient documentation

## 2012-06-06 DIAGNOSIS — R109 Unspecified abdominal pain: Secondary | ICD-10-CM | POA: Insufficient documentation

## 2012-06-06 DIAGNOSIS — I2510 Atherosclerotic heart disease of native coronary artery without angina pectoris: Secondary | ICD-10-CM | POA: Insufficient documentation

## 2012-06-06 DIAGNOSIS — G8929 Other chronic pain: Secondary | ICD-10-CM | POA: Insufficient documentation

## 2012-06-06 DIAGNOSIS — E785 Hyperlipidemia, unspecified: Secondary | ICD-10-CM | POA: Insufficient documentation

## 2012-06-06 DIAGNOSIS — R110 Nausea: Secondary | ICD-10-CM | POA: Insufficient documentation

## 2012-06-06 DIAGNOSIS — Z79899 Other long term (current) drug therapy: Secondary | ICD-10-CM | POA: Insufficient documentation

## 2012-06-06 DIAGNOSIS — K219 Gastro-esophageal reflux disease without esophagitis: Secondary | ICD-10-CM | POA: Insufficient documentation

## 2012-06-06 DIAGNOSIS — C189 Malignant neoplasm of colon, unspecified: Secondary | ICD-10-CM | POA: Insufficient documentation

## 2012-06-06 DIAGNOSIS — Z87891 Personal history of nicotine dependence: Secondary | ICD-10-CM | POA: Insufficient documentation

## 2012-06-06 HISTORY — DX: Calculus of kidney: N20.0

## 2012-06-06 LAB — COMPREHENSIVE METABOLIC PANEL
ALT: 15 U/L (ref 0–35)
AST: 51 U/L — ABNORMAL HIGH (ref 0–37)
Albumin: 4 g/dL (ref 3.5–5.2)
Calcium: 11.2 mg/dL — ABNORMAL HIGH (ref 8.4–10.5)
Chloride: 103 mEq/L (ref 96–112)
Creatinine, Ser: 0.6 mg/dL (ref 0.50–1.10)
Sodium: 140 mEq/L (ref 135–145)
Total Bilirubin: 0.3 mg/dL (ref 0.3–1.2)

## 2012-06-06 LAB — CBC WITH DIFFERENTIAL/PLATELET
Basophils Absolute: 0 10*3/uL (ref 0.0–0.1)
Lymphs Abs: 1.2 10*3/uL (ref 0.7–4.0)
MCV: 68.1 fL — ABNORMAL LOW (ref 78.0–100.0)
Monocytes Relative: 7 % (ref 3–12)
Neutrophils Relative %: 67 % (ref 43–77)
Platelets: 301 10*3/uL (ref 150–400)
RBC: 4.45 MIL/uL (ref 3.87–5.11)
RDW: 20.6 % — ABNORMAL HIGH (ref 11.5–15.5)
WBC: 9.3 10*3/uL (ref 4.0–10.5)

## 2012-06-06 MED ORDER — MORPHINE SULFATE 2 MG/ML IJ SOLN
2.0000 mg | Freq: Once | INTRAMUSCULAR | Status: AC
  Administered 2012-06-06: 2 mg via INTRAVENOUS
  Filled 2012-06-06: qty 1

## 2012-06-06 MED ORDER — IOHEXOL 300 MG/ML  SOLN
20.0000 mL | INTRAMUSCULAR | Status: AC
  Administered 2012-06-06: 20 mL via ORAL

## 2012-06-06 MED ORDER — ONDANSETRON HCL 4 MG/2ML IJ SOLN
4.0000 mg | Freq: Once | INTRAMUSCULAR | Status: AC
  Administered 2012-06-06: 4 mg via INTRAVENOUS
  Filled 2012-06-06: qty 2

## 2012-06-06 MED ORDER — SODIUM CHLORIDE 0.9 % IV SOLN
Freq: Once | INTRAVENOUS | Status: AC
  Administered 2012-06-06: 23:00:00 via INTRAVENOUS

## 2012-06-06 NOTE — ED Notes (Signed)
Pt states that she started having abdominal pain this morning after taking her iron pill last night. Pt complaining of generalized abdominal pain.  Pt states bowel movement yesterday but today no bowel movement. Pt states no problems with urination. Pt denies N/V.

## 2012-06-06 NOTE — ED Provider Notes (Signed)
Pt with lower abdominal tenderness, will obtain CT imaging Stable at this time Patient/family agreeable BP 145/63  Pulse 97  Temp 98.6 F (37 C) (Oral)  Resp 16  SpO2 98%   Joya Gaskins, MD 06/06/12 2205

## 2012-06-06 NOTE — ED Notes (Signed)
Pt comes to the ED complaining of nausea this morning and states she has been trying to make herself vomit but has been unable to.  Pt also reports generalized abdominal pain.  States she feels like she needs to have a bowel movement but cannot.

## 2012-06-06 NOTE — ED Provider Notes (Signed)
History     CSN: [id number]  Arrival date & time 06/06/12  1941   First MD Initiated Contact with Patient 06/06/12 2138      Chief Complaint  Patient presents with  . Abdominal Pain    (Consider location/radiation/quality/duration/timing/severity/associated sxs/prior treatment) Patient is a 78 y.o. female presenting with abdominal pain. The history is provided by the patient.  Abdominal Pain The primary symptoms of the illness include abdominal pain and nausea. The primary symptoms of the illness do not include fever, shortness of breath, vomiting or dysuria. The current episode started 6 to 12 hours ago.  Additional symptoms associated with the illness include back pain. Associated symptoms comments: Pain across mid-abdomen that started this morning and persisted through today. She feels it may be related to taking her iron pill on an empty stomach. No fever. She has had nausea without vomiting. She reports she is urinating less but without dysuria. Normal bowel movement yesterday. She states it feels similar to when she was diagnosed with gall stones before. .    Past Medical History  Diagnosis Date  . CAD (coronary artery disease) 12/09    CAD status post successful percutanous coronary intervention and steneting of the first obtuse marginal with placement of a 2.75 x 23 mm XIENCE V drug-eluting   . HTN (hypertension)   . Hyperlipidemia   . GERD (gastroesophageal reflux disease)   . Chronic back pain   . Kidney stone     History reviewed. No pertinent past surgical history.  Family History  Problem Relation Age of Onset  . Coronary artery disease Neg Hx     History  Substance Use Topics  . Smoking status: Former Games developer  . Smokeless tobacco: Not on file   Comment: quit approximately 30 years ao   . Alcohol Use: No     quit approximately 30 years ago     OB History    Grav Para Term Preterm Abortions TAB SAB Ect Mult Living                  Review of Systems    Constitutional: Negative for fever.  Respiratory: Negative for shortness of breath.   Gastrointestinal: Positive for nausea and abdominal pain. Negative for vomiting.  Genitourinary: Positive for decreased urine volume. Negative for dysuria.  Musculoskeletal: Positive for back pain.    Allergies  Review of patient's allergies indicates no known allergies.  Home Medications   Current Outpatient Rx  Name Route Sig Dispense Refill  . ACETAMINOPHEN 500 MG PO TABS Oral Take 500 mg by mouth daily.      Marland Kitchen AMLODIPINE BESY-BENAZEPRIL HCL 10-20 MG PO CAPS Oral Take 1 capsule by mouth daily.      . AMOXICILLIN 500 MG PO CAPS Oral Take 500 mg by mouth 2 (two) times daily.      . ASPIRIN 81 MG PO TABS Oral Take 81 mg by mouth daily.      Marland Kitchen CALCIUM CARBONATE-VITAMIN D 500-200 MG-UNIT PO TABS Oral Take 1 tablet by mouth daily.      Marland Kitchen CLARITHROMYCIN 500 MG PO TABS Oral Take 500 mg by mouth 2 (two) times daily.      Marland Kitchen LATANOPROST 0.005 % OP SOLN  1 drop at bedtime.      Marland Kitchen METOPROLOL TARTRATE 25 MG PO TABS Oral Take 25 mg by mouth daily.      Marland Kitchen NITROSTAT 0.4 MG SL SUBL  DISSOLVE ONE (1) TABLET UNDER THE TONGUE AS DIRECTED AS  NEEDED FOR CHEST PAIN 25 each 12  . OMEPRAZOLE 20 MG PO CPDR Oral Take 20 mg by mouth daily.      Marland Kitchen SIMVASTATIN 40 MG PO TABS Oral Take 40 mg by mouth at bedtime.        BP 145/63  Pulse 97  Temp 98.6 F (37 C) (Oral)  Resp 16  SpO2 98%  Physical Exam  Constitutional: She appears well-developed and well-nourished. No distress.  HENT:  Head: Normocephalic.  Neck: Normal range of motion. Neck supple.  Cardiovascular: Normal rate and regular rhythm.   Pulmonary/Chest: Effort normal and breath sounds normal.  Abdominal: Soft. Bowel sounds are normal. There is no tenderness. There is no rebound and no guarding.       She reports that her abdominal pain is no worse with palpation.  Genitourinary:       No CVA tenderness.  Musculoskeletal: Normal range of motion.   Neurological: She is alert. No cranial nerve deficit.  Skin: Skin is warm and dry. No rash noted.  Psychiatric: She has a normal mood and affect.    ED Course  Procedures (including critical care time)   Labs Reviewed  CBC WITH DIFFERENTIAL  COMPREHENSIVE METABOLIC PANEL  LIPASE, BLOOD  URINALYSIS, ROUTINE W REFLEX MICROSCOPIC  URINE CULTURE   No results found.  Results for orders placed during the hospital encounter of 06/06/12  CBC WITH DIFFERENTIAL      Component Value Range   WBC 9.3  4.0 - 10.5 K/uL   RBC 4.45  3.87 - 5.11 MIL/uL   Hemoglobin 8.5 (*) 12.0 - 15.0 g/dL   HCT 11.9 (*) 14.7 - 82.9 %   MCV 68.1 (*) 78.0 - 100.0 fL   MCH 19.1 (*) 26.0 - 34.0 pg   MCHC 28.1 (*) 30.0 - 36.0 g/dL   RDW 56.2 (*) 13.0 - 86.5 %   Platelets 301  150 - 400 K/uL   Neutrophils Relative 67  43 - 77 %   Lymphocytes Relative 13  12 - 46 %   Monocytes Relative 7  3 - 12 %   Eosinophils Relative 13 (*) 0 - 5 %   Basophils Relative 0  0 - 1 %   Neutro Abs 6.2  1.7 - 7.7 K/uL   Lymphs Abs 1.2  0.7 - 4.0 K/uL   Monocytes Absolute 0.7  0.1 - 1.0 K/uL   Eosinophils Absolute 1.2 (*) 0.0 - 0.7 K/uL   Basophils Absolute 0.0  0.0 - 0.1 K/uL   RBC Morphology MARKED POLYCHROMASIA    COMPREHENSIVE METABOLIC PANEL      Component Value Range   Sodium 140  135 - 145 mEq/L   Potassium 4.0  3.5 - 5.1 mEq/L   Chloride 103  96 - 112 mEq/L   CO2 24  19 - 32 mEq/L   Glucose, Bld 103 (*) 70 - 99 mg/dL   BUN 9  6 - 23 mg/dL   Creatinine, Ser 7.84  0.50 - 1.10 mg/dL   Calcium 69.6 (*) 8.4 - 10.5 mg/dL   Total Protein 8.8 (*) 6.0 - 8.3 g/dL   Albumin 4.0  3.5 - 5.2 g/dL   AST 51 (*) 0 - 37 U/L   ALT 15  0 - 35 U/L   Alkaline Phosphatase 168 (*) 39 - 117 U/L   Total Bilirubin 0.3  0.3 - 1.2 mg/dL   GFR calc non Af Amer 82 (*) >90 mL/min   GFR calc Af Amer >90  >  90 mL/min  LIPASE, BLOOD      Component Value Range   Lipase 11  11 - 59 U/L  URINALYSIS, ROUTINE W REFLEX MICROSCOPIC      Component  Value Range   Color, Urine YELLOW  YELLOW   APPearance CLOUDY (*) CLEAR   Specific Gravity, Urine 1.016  1.005 - 1.030   pH 7.5  5.0 - 8.0   Glucose, UA NEGATIVE  NEGATIVE mg/dL   Hgb urine dipstick NEGATIVE  NEGATIVE   Bilirubin Urine NEGATIVE  NEGATIVE   Ketones, ur 15 (*) NEGATIVE mg/dL   Protein, ur NEGATIVE  NEGATIVE mg/dL   Urobilinogen, UA 0.2  0.0 - 1.0 mg/dL   Nitrite NEGATIVE  NEGATIVE   Leukocytes, UA NEGATIVE  NEGATIVE  LACTIC ACID, PLASMA      Component Value Range   Lactic Acid, Venous 2.2  0.5 - 2.2 mmol/L   Ct Abdomen Pelvis W Contrast  06/07/2012  *RADIOLOGY REPORT*  Clinical Data: Abdominal pain  CT ABDOMEN AND PELVIS WITH CONTRAST  Technique:  Multidetector CT imaging of the abdomen and pelvis was performed following the standard protocol during bolus administration of intravenous contrast.  Contrast: OMNIPAQUE IOHEXOL 300 MG/ML  SOLN  Comparison: 02/27/2008  Findings: Abnormal circumferential wall thickening in the region of the cecum is worrisome for adenocarcinoma.  Abnormal adenopathy in the cecal mesentery is present.  There is a bilobed soft tissue mass in the retroperitoneum posterior to the cecum measuring 2.8 x 1.1 cm worrisome for metastatic disease.  Several lesions are scattered throughout the liver.  The largest is at the dome in the anterior segment of the right lobe measuring 5.7 x 4.7 cm.  Effusions are scattered in both lobes of the liver. Benign appearing cysts are associated in the liver.  Bilateral pulmonary nodules are noted at the lung bases.  The largest is in the right lower lobe measuring 9 mm.  The  Pancreas is atrophic.  Spleen  is unremarkable.  Unremarkable gallbladder.  Adrenal glands are diffusely prominent.  Small hypodensity in the left kidney is likely a tiny cyst.  No free fluid.  Sigmoid diverticulosis.  Apparent wall prominence of the sigmoid colon likely reflects decompression.  Severe degenerative disc disease in the lumbar spine.   IMPRESSION: Above findings most likely represent malignancy of the cecum with local retroperitoneal and nodal metastatic disease associated with metastatic disease in the liver and lungs.  Original Report Authenticated By: Donavan Burnet, M.D.   No diagnosis found. 1. Colon cancer 2. abdominal pain   MDM  Pain medication given with relief of pain. CT scan showing cancer of cecum with retroperitoneal nodes,  pattern of metastatic liver and lung CA. Discussed findings with patient and family. All questions answered. Will refer to oncology. Patient stable for discharged.        Rodena Medin, PA-C 06/07/12 0036

## 2012-06-07 LAB — URINALYSIS, ROUTINE W REFLEX MICROSCOPIC
Glucose, UA: NEGATIVE mg/dL
Leukocytes, UA: NEGATIVE
pH: 7.5 (ref 5.0–8.0)

## 2012-06-07 MED ORDER — IOHEXOL 300 MG/ML  SOLN
100.0000 mL | Freq: Once | INTRAMUSCULAR | Status: AC | PRN
  Administered 2012-06-07: 100 mL via INTRAVENOUS

## 2012-06-07 MED ORDER — HYDROCODONE-ACETAMINOPHEN 5-325 MG PO TABS: 1.0000 | ORAL_TABLET | ORAL | Status: AC | PRN

## 2012-06-07 MED ORDER — ONDANSETRON HCL 4 MG PO TABS: 4.0000 mg | ORAL_TABLET | Freq: Four times a day (QID) | ORAL | Status: AC

## 2012-06-08 ENCOUNTER — Other Ambulatory Visit: Payer: Self-pay | Admitting: *Deleted

## 2012-06-08 DIAGNOSIS — Z9189 Other specified personal risk factors, not elsewhere classified: Secondary | ICD-10-CM

## 2012-06-08 LAB — URINE CULTURE: Colony Count: NO GROWTH

## 2012-06-08 NOTE — ED Provider Notes (Signed)
Medical screening examination/treatment/procedure(s) were conducted as a shared visit with non-physician practitioner(s) and myself.  I personally evaluated the patient during the encounter   Pt seen with PA, diffuse abd. Tenderness on my exam.  Signed out to dr Norlene Campbell pending CT imaging  Joya Gaskins, MD 06/08/12 1556

## 2012-06-09 ENCOUNTER — Telehealth: Payer: Self-pay | Admitting: Hematology & Oncology

## 2012-06-09 ENCOUNTER — Telehealth: Payer: Self-pay | Admitting: Oncology

## 2012-06-09 ENCOUNTER — Telehealth: Payer: Self-pay

## 2012-06-09 NOTE — Telephone Encounter (Signed)
Called pt today re new pt appt w/BS. Per pt her children want her to go to [place] and from [place] to [facility]. No appt schedule and Vicie Mutters informed of above. I also called pt back to double check to see if her children wanted her to go to our office in [facility] ([place] or to [facility]. Per pt she believes it is [facility]. Chart back to HIM w/above note attached.

## 2012-06-09 NOTE — Telephone Encounter (Signed)
Ok, thanks.

## 2012-06-09 NOTE — Telephone Encounter (Signed)
Made patient aware of 06/15/12 appointment.

## 2012-06-09 NOTE — Telephone Encounter (Signed)
Phone call received from Dr Gustavo Lah office about the pt having her colonoscopy scheduled.  Dr Myna Hidalgo would prefer to see the pt first before anything is scheduled.  We will be contacted after the visit with Dr Myna Hidalgo if he would like to proceed.

## 2012-06-15 ENCOUNTER — Ambulatory Visit: Payer: Medicare Other | Admitting: Hematology & Oncology

## 2012-06-15 ENCOUNTER — Ambulatory Visit: Payer: Medicare Other

## 2012-06-15 ENCOUNTER — Other Ambulatory Visit: Payer: Medicare Other | Admitting: Lab

## 2012-06-22 ENCOUNTER — Telehealth: Payer: Self-pay | Admitting: Hematology & Oncology

## 2012-06-22 NOTE — Telephone Encounter (Signed)
Called pt to see if she wanted to reschedule 06-15-12 no show. She said her kids made arrangements for her to go somewhere else.

## 2012-06-23 ENCOUNTER — Telehealth: Payer: Self-pay | Admitting: *Deleted

## 2012-06-23 NOTE — Telephone Encounter (Signed)
Phone call to patient to see if she is going to need an appointment at [facility]  since she told [facility]'s office she was going somewhere else and since she has a [place] address.  Patient stated her children have arranged for her to go to [facility].  The patient could not tell this RN who she was going to see or when her appointment is.  This RN attempted to contact next of kin contact phone numbers.  Both numbers were out of service.

## 2012-06-24 ENCOUNTER — Telehealth: Payer: Self-pay | Admitting: *Deleted

## 2012-06-24 NOTE — Telephone Encounter (Signed)
Spoke with patient's daughter, Regenia Skeeter, who stated they have arranged for their mother's care with Oncologist  at [facility] in [facility] Dr. Allyne Gee.  She is scheduled for a colonoscopy with Dr. Loreta Ave at [facility].  She was appreciative of the follow-up call ensuring her mother is receiving care.

## 2012-06-29 ENCOUNTER — Other Ambulatory Visit: Payer: Self-pay | Admitting: Gastroenterology

## 2012-06-29 DIAGNOSIS — R933 Abnormal findings on diagnostic imaging of other parts of digestive tract: Secondary | ICD-10-CM

## 2012-06-30 ENCOUNTER — Other Ambulatory Visit: Payer: Self-pay | Admitting: Radiology

## 2012-07-02 ENCOUNTER — Encounter (HOSPITAL_COMMUNITY): Payer: Self-pay | Admitting: Pharmacy Technician

## 2012-07-03 ENCOUNTER — Encounter (HOSPITAL_COMMUNITY): Payer: Self-pay

## 2012-07-03 ENCOUNTER — Ambulatory Visit (HOSPITAL_COMMUNITY)
Admission: RE | Admit: 2012-07-03 | Discharge: 2012-07-03 | Disposition: A | Discharge status: Home / Self Care | Payer: PRIVATE HEALTH INSURANCE | Source: Ambulatory Visit | Attending: Gastroenterology | Admitting: Gastroenterology

## 2012-07-03 ENCOUNTER — Other Ambulatory Visit: Payer: Self-pay

## 2012-07-03 ENCOUNTER — Inpatient Hospital Stay (HOSPITAL_COMMUNITY)
Admission: EM | Admit: 2012-07-03 | Discharge: 2012-07-04 | DRG: 312 | Disposition: A | Discharge status: Home / Self Care | Payer: PRIVATE HEALTH INSURANCE | Attending: Internal Medicine | Admitting: Internal Medicine

## 2012-07-03 ENCOUNTER — Emergency Department (HOSPITAL_COMMUNITY): Payer: PRIVATE HEALTH INSURANCE

## 2012-07-03 DIAGNOSIS — K219 Gastro-esophageal reflux disease without esophagitis: Secondary | ICD-10-CM | POA: Diagnosis present

## 2012-07-03 DIAGNOSIS — M549 Dorsalgia, unspecified: Secondary | ICD-10-CM | POA: Diagnosis present

## 2012-07-03 DIAGNOSIS — E785 Hyperlipidemia, unspecified: Secondary | ICD-10-CM | POA: Diagnosis present

## 2012-07-03 DIAGNOSIS — I10 Essential (primary) hypertension: Secondary | ICD-10-CM

## 2012-07-03 DIAGNOSIS — R933 Abnormal findings on diagnostic imaging of other parts of digestive tract: Secondary | ICD-10-CM

## 2012-07-03 DIAGNOSIS — Z87891 Personal history of nicotine dependence: Secondary | ICD-10-CM

## 2012-07-03 DIAGNOSIS — C787 Secondary malignant neoplasm of liver and intrahepatic bile duct: Secondary | ICD-10-CM | POA: Diagnosis present

## 2012-07-03 DIAGNOSIS — Z79899 Other long term (current) drug therapy: Secondary | ICD-10-CM

## 2012-07-03 DIAGNOSIS — R55 Syncope and collapse: Secondary | ICD-10-CM | POA: Diagnosis present

## 2012-07-03 DIAGNOSIS — C180 Malignant neoplasm of cecum: Secondary | ICD-10-CM | POA: Diagnosis present

## 2012-07-03 DIAGNOSIS — I440 Atrioventricular block, first degree: Secondary | ICD-10-CM | POA: Diagnosis present

## 2012-07-03 DIAGNOSIS — Z87442 Personal history of urinary calculi: Secondary | ICD-10-CM

## 2012-07-03 DIAGNOSIS — Z7982 Long term (current) use of aspirin: Secondary | ICD-10-CM

## 2012-07-03 DIAGNOSIS — I959 Hypotension, unspecified: Secondary | ICD-10-CM | POA: Diagnosis present

## 2012-07-03 DIAGNOSIS — I951 Orthostatic hypotension: Principal | ICD-10-CM | POA: Diagnosis present

## 2012-07-03 DIAGNOSIS — E782 Mixed hyperlipidemia: Secondary | ICD-10-CM

## 2012-07-03 DIAGNOSIS — I2510 Atherosclerotic heart disease of native coronary artery without angina pectoris: Secondary | ICD-10-CM | POA: Diagnosis present

## 2012-07-03 DIAGNOSIS — Z9861 Coronary angioplasty status: Secondary | ICD-10-CM

## 2012-07-03 DIAGNOSIS — G8929 Other chronic pain: Secondary | ICD-10-CM | POA: Diagnosis present

## 2012-07-03 LAB — CBC
HCT: 29.9 % — ABNORMAL LOW (ref 36.0–46.0)
Hemoglobin: 8.5 g/dL — ABNORMAL LOW (ref 12.0–15.0)
MCV: 67.5 fL — ABNORMAL LOW (ref 78.0–100.0)
RDW: 21.3 % — ABNORMAL HIGH (ref 11.5–15.5)
WBC: 5.8 10*3/uL (ref 4.0–10.5)

## 2012-07-03 LAB — BASIC METABOLIC PANEL
BUN: 10 mg/dL (ref 6–23)
CO2: 23 mEq/L (ref 19–32)
Calcium: 10.9 mg/dL — ABNORMAL HIGH (ref 8.4–10.5)
Creatinine, Ser: 0.62 mg/dL (ref 0.50–1.10)
GFR calc non Af Amer: 81 mL/min — ABNORMAL LOW (ref >90)
Glucose, Bld: 110 mg/dL — ABNORMAL HIGH (ref 70–99)

## 2012-07-03 LAB — POCT I-STAT TROPONIN I: Troponin i, poc: 0.01 ng/mL (ref 0.00–0.08)

## 2012-07-03 LAB — GLUCOSE, CAPILLARY: Glucose-Capillary: 102 mg/dL — ABNORMAL HIGH (ref 70–99)

## 2012-07-03 MED ORDER — SODIUM CHLORIDE 0.9 % IV SOLN
1000.0000 mL | INTRAVENOUS | Status: DC
  Administered 2012-07-04: 1000 mL via INTRAVENOUS

## 2012-07-03 MED ORDER — FENTANYL CITRATE 0.05 MG/ML IJ SOLN
INTRAMUSCULAR | Status: AC | PRN
  Administered 2012-07-03 (×2): 25 ug via INTRAVENOUS

## 2012-07-03 MED ORDER — SODIUM CHLORIDE 0.9 % IV SOLN: Freq: Once | INTRAVENOUS | Status: DC

## 2012-07-03 MED ORDER — HYDROCODONE-ACETAMINOPHEN 5-325 MG PO TABS: 1.0000 | ORAL_TABLET | ORAL | Status: DC | PRN

## 2012-07-03 MED ORDER — FENTANYL CITRATE 0.05 MG/ML IJ SOLN
INTRAMUSCULAR | Status: AC
  Filled 2012-07-03: qty 4

## 2012-07-03 MED ORDER — MIDAZOLAM HCL 5 MG/5ML IJ SOLN
INTRAMUSCULAR | Status: AC | PRN
  Administered 2012-07-03 (×2): 1 mg via INTRAVENOUS

## 2012-07-03 MED ORDER — SODIUM CHLORIDE 0.9 % IV SOLN
1000.0000 mL | Freq: Once | INTRAVENOUS | Status: AC
  Administered 2012-07-03: 1000 mL via INTRAVENOUS

## 2012-07-03 MED ORDER — MIDAZOLAM HCL 2 MG/2ML IJ SOLN
INTRAMUSCULAR | Status: AC
  Filled 2012-07-03: qty 4

## 2012-07-03 NOTE — H&P (Signed)
Kiara Watkins is an 78 y.o. female.   Chief Complaint: abd pain ; bowel problems x weeks CT shows cecum lesion; retroperitoneal nodes; liver and lung lesions Scheduled for liver lesion biopsy HPI: CAD; HTN; hyperlipidemia; GERD  Past Medical History  Diagnosis Date  . CAD (coronary artery disease) 12/09    CAD status post successful percutanous coronary intervention and steneting of the first obtuse marginal with placement of a 2.75 x 23 mm XIENCE V drug-eluting   . HTN (hypertension)   . Hyperlipidemia   . GERD (gastroesophageal reflux disease)   . Chronic back pain   . Kidney stone     No past surgical history on file.  Family History  Problem Relation Age of Onset  . Coronary artery disease Neg Hx    Social History:  reports that she has quit smoking. She does not have any smokeless tobacco history on file. She reports that she does not drink alcohol or use illicit drugs.  Allergies: No Known Allergies   (Not in a hospital admission)  No results found for this or any previous visit (from the past 48 hour(s)). No results found.  Review of Systems  Constitutional: Positive for weight loss. Negative for fever.  Respiratory: Negative for cough and shortness of breath.   Cardiovascular: Negative for chest pain.  Gastrointestinal: Positive for abdominal pain. Negative for vomiting.  Musculoskeletal: Positive for back pain.  Neurological: Negative for headaches.  Psychiatric/Behavioral: The patient is nervous/anxious.     Blood pressure 143/78, pulse 79, temperature 98.5 F (36.9 C), temperature source Oral, resp. rate 16, height 5\' 2"  (1.575 m), weight 133 lb (60.328 kg), SpO2 95.00%. Physical Exam  Constitutional: She is oriented to person, place, and time. She appears well-developed and well-nourished.  Cardiovascular: Normal rate, regular rhythm and normal heart sounds.   No murmur heard. Respiratory: Effort normal and breath sounds normal. She has no wheezes.  GI:  Soft. Bowel sounds are normal. There is no tenderness.  Musculoskeletal: Normal range of motion.  Neurological: She is alert and oriented to person, place, and time.  Skin: Skin is warm and dry.  Psychiatric: She has a normal mood and affect. Her behavior is normal. Judgment and thought content normal.     Assessment/Plan abd pain; wt loss x weeks Abn CT Scheduled for liver lesion biopsy Pt and family aware of procedure benefits and risks and agreeable to proceed. Consent signed   Leeba Barbe A 07/03/2012, 1:57 PM

## 2012-07-03 NOTE — ED Notes (Signed)
Per ems- pt was at restaurant, experienced syncopal episode. Pt did not fall. Pt was [facility] today, had nothing to eat since yesterday as was NPO. Pt was cold, clammy, diaphoretic, nauseous, vomited x4. Pt initial BP-90/60 CBG-110 HR-70 sinus with PVCs.

## 2012-07-03 NOTE — ED Notes (Signed)
Per pt's family - pt had not eaten in a while d/t a liver biopsy. They went to a restaurant to eat when the pt became to feel ill so they started to go outside, then the pt became nauseous, vomited and the passed out. Pt didn't fall.

## 2012-07-03 NOTE — ED Notes (Signed)
Pt reports she is feeling much better

## 2012-07-03 NOTE — ED Provider Notes (Signed)
History     CSN: [id number]  Arrival date & time 07/03/12  1945   None     Chief Complaint  Patient presents with  . Loss of Consciousness    (Consider location/radiation/quality/duration/timing/severity/associated sxs/prior treatment) HPI This is an 78 year old woman, brought in by family after history of fainting at about 1930hrs. She had a liver biopsy performed today at about 14 hours and she fainted on the her way home. Her daughter held her before she fell down. He pass out for about 20 minutes which time she was diaphoretic, cold and calmy. The symptoms started shortly after she complained about the smell of the restaurant where she had been taken her family to eat. EMS was called and reported that her BP 90/60 mmHg, CBG was 110, and she was cold and clammy and diaphoretic.   There is no history of shortness of breath or chest pain. The biopsy was performed by interventional radiology at [facility] after sedation with midazolam and fentanyl. She had been fasted several hours prior to the procedure and at that time she had the syncopal episode she had not eaten yet. No previous history of syncope episodes. At the time of arrival to the ED, she remained fully alert and she denied history of loss of consciousness, seizures, or tremors, accompanying the syncope episode.    At arrival to the ED, BP 138/52 lying and hypotension of 87/71 on standing.  The liver biopsy was performed to do to an ultrasound that had shown suspicious lesions in the liver, likely from a colon malignancy.  She has a history of recent chest pain with typical and atypical features. DES in December 2009 with no recent ischemic workup since then. Myoview scan performed recently was negative.  PCP is Dr Renae Gloss of [facility].   Past Medical History  Diagnosis Date  . CAD (coronary artery disease) 12/09    CAD status post successful percutanous coronary intervention and steneting of the first obtuse  marginal with placement of a 2.75 x 23 mm XIENCE V drug-eluting   . HTN (hypertension)   . Hyperlipidemia   . GERD (gastroesophageal reflux disease)   . Chronic back pain   . Kidney stone     History reviewed. No pertinent past surgical history.  Family History  Problem Relation Age of Onset  . Coronary artery disease Neg Hx     History  Substance Use Topics  . Smoking status: Former Games developer  . Smokeless tobacco: Not on file   Comment: quit approximately 30 years ao   . Alcohol Use: No     quit approximately 30 years ago     OB History    Grav Para Term Preterm Abortions TAB SAB Ect Mult Living                  Review of Systems  Constitutional: Negative.   HENT: Negative.   Eyes: Negative.   Respiratory: Negative for cough, choking, chest tightness, shortness of breath, wheezing and stridor.   Cardiovascular: Negative for chest pain, palpitations and leg swelling.  Gastrointestinal: Positive for nausea, vomiting and abdominal pain. Negative for constipation, abdominal distention, anal bleeding and rectal pain.  Genitourinary: Negative.   Musculoskeletal: Negative.   Skin: Negative.   Neurological: Negative for tremors, seizures, facial asymmetry, speech difficulty, weakness and numbness.  Psychiatric/Behavioral: Negative.     Allergies  Review of patient's allergies indicates no known allergies.  Home Medications   Current Outpatient Rx  Name Route Sig  Dispense Refill  . AMLODIPINE BESY-BENAZEPRIL HCL 10-20 MG PO CAPS Oral Take 1 capsule by mouth daily.      . ASPIRIN 81 MG PO TABS Oral Take 81 mg by mouth daily.      Marland Kitchen CALCIUM CARBONATE-VITAMIN D 500-200 MG-UNIT PO TABS Oral Take 1 tablet by mouth daily.      . IBUPROFEN 200 MG PO TABS Oral Take 200 mg by mouth every 6 (six) hours as needed. For headache    . LATANOPROST 0.005 % OP SOLN Both Eyes Place 1 drop into both eyes at bedtime.     Marland Kitchen NITROGLYCERIN 0.4 MG SL SUBL Sublingual Place 0.4 mg under the tongue  every 5 (five) minutes as needed. For chest pain    . OMEPRAZOLE 20 MG PO CPDR Oral Take 20 mg by mouth daily.      Marland Kitchen VITAMIN D (ERGOCALCIFEROL) 50000 UNITS PO CAPS Oral Take 50,000 Units by mouth 2 (two) times a week.      BP 129/74  Pulse 63  Resp 16  SpO2 93%  Physical Exam  Constitutional: She is oriented to person, place, and time. She appears well-developed and well-nourished. No distress.  HENT:  Head: Normocephalic and atraumatic.  Eyes: Conjunctivae and EOM are normal. Pupils are equal, round, and reactive to light.  Neck: Normal range of motion. Neck supple.  Cardiovascular: Normal rate, regular rhythm and normal heart sounds.  Exam reveals no gallop and no friction rub.   No murmur heard. Pulmonary/Chest: Effort normal and breath sounds normal. No respiratory distress. She has no wheezes. She has no rales. She exhibits no tenderness.  Abdominal: Soft. Bowel sounds are normal. She exhibits no distension and no mass. There is tenderness. There is no rebound and no guarding.       The biopsy site is just slightly tender, without any active bleeding. There is no suspicion for intraperitoneal bleed.  Musculoskeletal: She exhibits no edema and no tenderness.  Neurological: She is alert and oriented to person, place, and time. She has normal reflexes. She is not disoriented. She displays no atrophy and normal reflexes. No cranial nerve deficit or sensory deficit. She exhibits normal muscle tone. Gait normal. GCS eye subscore is 4. GCS verbal subscore is 5. GCS motor subscore is 6.  Skin: Skin is warm and dry. She is not diaphoretic.    ED Course  Procedures (including critical care time)  Labs Reviewed  GLUCOSE, CAPILLARY - Abnormal; Notable for the following:    Glucose-Capillary 102 (*)     All other components within normal limits   US Biopsy  07/03/2012  *RADIOLOGY REPORT*  Clinical history:78 year old with a colon lesion and suspicious liver lesions.  Findings are  concerning for metastatic disease.  PROCEDURE(S): ULTRASOUND GUIDED LIVER LESION BIOPSY  Physician: Rachelle Hora. Henn, MD  Medications:Versed 2 mg, Fentanyl 50 mcg. A radiology nurse monitored the patient for moderate sedation.  Moderate sedation time:23 minutes  Fluoroscopy time: None  Procedure:Informed consent was obtained for a liver lesion biopsy. The liver was evaluated with ultrasound.  The right side of the abdomen was prepped and draped in a sterile fashion.  Skin and liver capsule were anesthetized with 1% lidocaine.  A 17 gauge needle was directed in the right hepatic lesion with ultrasound guidance.  Three core biopsies were obtained with an 18 gauge device.  Samples were placed in [place].  Findings:Heterogeneous hyperechoic area involving the right hepatic lobe.  Findings are compatible with the known lesion.  Needle  was directed to this lesion and three core biopsies were obtained.  Complications: None  Impression:Ultrasound guided core biopsies of the right hepatic lesion.   Original Report Authenticated By: Richarda Overlie, M.D.       Date: 07/03/2012  Rate: 54  Rhythm: normal sinus rhythm  QRS Axis: normal  Intervals: PR prolonged  ST/T Wave abnormalities: normal  Conduction Disutrbances:first-degree A-V block   Narrative Interpretation:   Old EKG Reviewed: unchanged     MDM  In this 78 year old woman, who comes in with a history of a syncopal episode that lasted about 20 minutes and signs of orthostatic hypotension, I am concerned about cardiac verses a neurological cause for the symptoms. Initial evaluation does not indicate any focal neurological deficits. She has been resuscitated with 1 L of IV fluids. A chest x-ray, noncontrasted brain CT scan, and cardiac enzymes have been ordered. The patient will be admitted for further evaluation and observation.        Dow Adolph, MD 07/04/12 [id number]

## 2012-07-03 NOTE — ED Provider Notes (Signed)
Date: 07/03/2012  Rate:59 bpm  Rhythm: sinus  QRS Axis: normal  Intervals: PR prolonged  ST/T Wave abnormalities: inf and lat ST changes  Conduction Disutrbances:first-degree A-V block   Narrative Interpretation:   Old EKG Reviewed: unchanged      Tobin Chad, MD 07/03/12 2138

## 2012-07-03 NOTE — Procedures (Signed)
US guided liver lesion biopsy.  3 cores obtained and no immediate complication.

## 2012-07-04 ENCOUNTER — Inpatient Hospital Stay (HOSPITAL_COMMUNITY): Payer: PRIVATE HEALTH INSURANCE

## 2012-07-04 ENCOUNTER — Encounter (HOSPITAL_COMMUNITY): Payer: Self-pay | Admitting: Emergency Medicine

## 2012-07-04 DIAGNOSIS — R55 Syncope and collapse: Secondary | ICD-10-CM

## 2012-07-04 DIAGNOSIS — I959 Hypotension, unspecified: Secondary | ICD-10-CM

## 2012-07-04 DIAGNOSIS — C801 Malignant (primary) neoplasm, unspecified: Secondary | ICD-10-CM

## 2012-07-04 DIAGNOSIS — C787 Secondary malignant neoplasm of liver and intrahepatic bile duct: Secondary | ICD-10-CM | POA: Diagnosis present

## 2012-07-04 HISTORY — PX: OTHER SURGICAL HISTORY: SHX169

## 2012-07-04 LAB — COMPREHENSIVE METABOLIC PANEL
ALT: 9 U/L (ref 0–35)
AST: 19 U/L (ref 0–37)
Alkaline Phosphatase: 141 U/L — ABNORMAL HIGH (ref 39–117)
CO2: 21 mEq/L (ref 19–32)
Calcium: 10.8 mg/dL — ABNORMAL HIGH (ref 8.4–10.5)
GFR calc non Af Amer: 82 mL/min — ABNORMAL LOW (ref >90)
Potassium: 3.8 mEq/L (ref 3.5–5.1)
Sodium: 141 mEq/L (ref 135–145)
Total Protein: 7.3 g/dL (ref 6.0–8.3)

## 2012-07-04 LAB — FERRITIN: Ferritin: 20 ng/mL (ref 10–291)

## 2012-07-04 LAB — HEMOGLOBIN A1C
Hgb A1c MFr Bld: 5.6 % (ref <5.7)
Mean Plasma Glucose: 114 mg/dL (ref <117)

## 2012-07-04 LAB — CBC
HCT: 28.2 % — ABNORMAL LOW (ref 36.0–46.0)
Hemoglobin: 8 g/dL — ABNORMAL LOW (ref 12.0–15.0)
MCH: 19.3 pg — ABNORMAL LOW (ref 26.0–34.0)
MCHC: 28.4 g/dL — ABNORMAL LOW (ref 30.0–36.0)
MCV: 68.1 fL — ABNORMAL LOW (ref 78.0–100.0)
RDW: 21.7 % — ABNORMAL HIGH (ref 11.5–15.5)

## 2012-07-04 LAB — VITAMIN B12: Vitamin B-12: 613 pg/mL (ref 211–911)

## 2012-07-04 LAB — RETICULOCYTES: Retic Count, Absolute: 66.2 10*3/uL (ref 19.0–186.0)

## 2012-07-04 LAB — IRON AND TIBC: Iron: 16 ug/dL — ABNORMAL LOW (ref 42–135)

## 2012-07-04 LAB — CARDIAC PANEL(CRET KIN+CKTOT+MB+TROPI): CK, MB: 1.5 ng/mL (ref 0.3–4.0)

## 2012-07-04 MED ORDER — ALBUTEROL SULFATE (5 MG/ML) 0.5% IN NEBU: 2.5000 mg | INHALATION_SOLUTION | RESPIRATORY_TRACT | Status: DC | PRN

## 2012-07-04 MED ORDER — NITROGLYCERIN 0.4 MG SL SUBL: 0.4000 mg | SUBLINGUAL_TABLET | SUBLINGUAL | Status: DC | PRN

## 2012-07-04 MED ORDER — PANTOPRAZOLE SODIUM 40 MG PO TBEC: 40.0000 mg | DELAYED_RELEASE_TABLET | Freq: Every day | ORAL | Status: DC

## 2012-07-04 MED ORDER — IOHEXOL 350 MG/ML SOLN
100.0000 mL | Freq: Once | INTRAVENOUS | Status: AC | PRN
  Administered 2012-07-04: 100 mL via INTRAVENOUS

## 2012-07-04 MED ORDER — ONDANSETRON HCL 4 MG PO TABS: 4.0000 mg | ORAL_TABLET | Freq: Four times a day (QID) | ORAL | Status: DC | PRN

## 2012-07-04 MED ORDER — SODIUM CHLORIDE 0.9 % IJ SOLN
3.0000 mL | Freq: Two times a day (BID) | INTRAMUSCULAR | Status: DC
  Administered 2012-07-04: 3 mL via INTRAVENOUS

## 2012-07-04 MED ORDER — HYDROCODONE-ACETAMINOPHEN 5-325 MG PO TABS: 1.0000 | ORAL_TABLET | ORAL | Status: DC | PRN

## 2012-07-04 MED ORDER — SODIUM CHLORIDE 0.9 % IV SOLN: 1000.0000 mL | INTRAVENOUS | Status: DC

## 2012-07-04 MED ORDER — ALUM & MAG HYDROXIDE-SIMETH 200-200-20 MG/5ML PO SUSP: 30.0000 mL | Freq: Four times a day (QID) | ORAL | Status: DC | PRN

## 2012-07-04 MED ORDER — LATANOPROST 0.005 % OP SOLN
1.0000 [drp] | Freq: Every day | OPHTHALMIC | Status: DC
  Filled 2012-07-04: qty 2.5

## 2012-07-04 MED ORDER — GUAIFENESIN-DM 100-10 MG/5ML PO SYRP: 5.0000 mL | ORAL_SOLUTION | ORAL | Status: DC | PRN

## 2012-07-04 MED ORDER — ASPIRIN EC 81 MG PO TBEC
81.0000 mg | DELAYED_RELEASE_TABLET | Freq: Every day | ORAL | Status: DC
  Filled 2012-07-04: qty 1

## 2012-07-04 MED ORDER — ACETAMINOPHEN 650 MG RE SUPP: 650.0000 mg | Freq: Four times a day (QID) | RECTAL | Status: DC | PRN

## 2012-07-04 MED ORDER — ONDANSETRON HCL 4 MG/2ML IJ SOLN: 4.0000 mg | Freq: Four times a day (QID) | INTRAMUSCULAR | Status: DC | PRN

## 2012-07-04 MED ORDER — ACETAMINOPHEN 325 MG PO TABS: 650.0000 mg | ORAL_TABLET | Freq: Four times a day (QID) | ORAL | Status: DC | PRN

## 2012-07-04 MED ORDER — DOCUSATE SODIUM 100 MG PO CAPS
100.0000 mg | ORAL_CAPSULE | Freq: Two times a day (BID) | ORAL | Status: DC
  Filled 2012-07-04 (×2): qty 1

## 2012-07-04 MED ORDER — SODIUM CHLORIDE 0.9 % IV SOLN
INTRAVENOUS | Status: DC
  Administered 2012-07-04: 02:00:00 via INTRAVENOUS

## 2012-07-04 NOTE — ED Notes (Signed)
IV team paged and on the way.

## 2012-07-04 NOTE — Discharge Summary (Signed)
Physician Discharge Summary  Kiara Watkins ZOX:[id number] DOB: November 07, 1927 DOA: 07/03/2012  PCP: Alva Garnet., MD  Admit date: 07/03/2012 Discharge date: 07/04/2012  Discharge Diagnoses:  Principal Problem:  *Syncope and collapse Active Problems:  Hypotension  Metastases to the liver   Discharge Condition: Stable  Disposition:  Follow-up Information    Follow up with Alva Garnet., MD. (hospital follow up)    Contact information:   [address] [place] [address] [phone]          Diet: Heart healthy diet Wt Readings from Last 3 Encounters:  07/04/12 59.92 kg (132 lb 1.6 oz)  07/03/12 60.328 kg (133 lb)  11/26/11 68.947 kg (152 lb)    History of present illness:  Kiara Watkins is a 78 y.o. female  has a past medical history of CAD (coronary artery disease) (12/09); HTN (hypertension); Hyperlipidemia; GERD (gastroesophageal reflux disease); Chronic back pain; and Kidney stone. Presented with She had a biopsy done last AM for liver mass. She has not eaten all day. After that she was trying to break her fast at the restaurant. States she smelled something bad felt lightheaded and passed out. Per family she patient appeared to be unconscious for about 20 min. She states she was aware of things around her and her family trying to help but felt too weak to answer. No seizulre like activity no chest pain or shortness of breath. she was nauseous and had some vomiting. In emergency department she was found to be orthostatic and hypotensive with systolic blood pressure down to 81. After the fluids this has improved with the latest blood pressure 167/80. She is currently feeling back to baseline. A CT angiogram chest showed no evidence of pulmonary embolism. There is some possible long metastatic disease. Results of her liver biopsy I still not available.  of note she had had an normal stress test done by Charlton Haws in January 2013  Concerning her  possible metastatic disease this was first found in the end of July after ER visit for abdominal pain. A CT scan at that time also found abnormal cecum mass worrisome for adenocarcinoma with metastatic disease. patient apparently has been scheduled to follow up of oncology at [facility] in [facility] Dr. Allyne Gee and also scheduled to have a colonoscopy done by Dr. Aneta Mins Course:  -syncope: She was admitted to telemetry with no dense an EKG of her heart was done that showed normal sinus rhythm with first degree AV block. Cardiac enzymes were cycled which were negative x2, a d-dimer was checked which was positive so a CT angiogram was done that was negative for PE for the result is below. Her electrolytes were within normal limits. nausea, pallor, and diaphoresis and lightheadedness before this episode happened which made me think this is more vasovagal. She relates she feels fine and wanted to go home so she was discharged.  Hypotension:  This was transient and improved with IV fluids his most likely secondary to vasovagal this is resolved she has had no further episodes during her hospital stay.  Mild hypercalcemia: Is most likely secondary to decreased intravascular volume. Going back to her records this has improved since last month. Today is only 10.8. she will need follow up with her primary care Dr. as an outpatient for further evaluation.  Discharge Exam: Filed Vitals:   07/04/12 0600  BP: 131/79  Pulse: 63  Temp: 98.3 F (36.8 C)  Resp: 16   Filed  Vitals:   07/04/12 0138 07/04/12 0200 07/04/12 0400 07/04/12 0600  BP: 167/80 155/78 126/73 131/79  Pulse: 83 80 71 63  Temp: 98 F (36.7 C) 98.1 F (36.7 C) 98.5 F (36.9 C) 98.3 F (36.8 C)  TempSrc: Oral Oral Oral Oral  Resp: 17 16 15 16   Height: 5\' 2"  (1.575 m)     Weight: 59.92 kg (132 lb 1.6 oz)     SpO2: 98% 99% 100% 99%   General: Awake alert and oriented times Cardiovascular: Regular rate and  rhythm Respiratory: Good air movement clear to auscultation  Discharge Instructions  Discharge Orders    Future Orders Please Complete By Expires   Diet - low sodium heart healthy      Increase activity slowly        Medication List  As of 07/04/2012  8:53 AM   TAKE these medications         amLODipine-benazepril 10-20 MG per capsule   Commonly known as: LOTREL   Take 1 capsule by mouth daily.      aspirin 81 MG tablet   Take 81 mg by mouth daily.      calcium-vitamin D 500-200 MG-UNIT per tablet   Commonly known as: OSCAL WITH D   Take 1 tablet by mouth daily.      ibuprofen 200 MG tablet   Commonly known as: ADVIL,MOTRIN   Take 200 mg by mouth every 6 (six) hours as needed. For headache      latanoprost 0.005 % ophthalmic solution   Commonly known as: XALATAN   Place 1 drop into both eyes at bedtime.      nitroGLYCERIN 0.4 MG SL tablet   Commonly known as: NITROSTAT   Place 0.4 mg under the tongue every 5 (five) minutes as needed. For chest pain      omeprazole 20 MG capsule   Commonly known as: PRILOSEC   Take 20 mg by mouth daily.      Vitamin D (Ergocalciferol) 50000 UNITS Caps   Commonly known as: DRISDOL   Take 50,000 Units by mouth 2 (two) times a week.              The results of significant diagnostics from this hospitalization (including imaging, microbiology, ancillary and laboratory) are listed below for reference.    Significant Diagnostic Studies: Ct Head Wo Contrast  07/03/2012  *RADIOLOGY REPORT*  Clinical Data: Syncope, dizziness.  CT HEAD WITHOUT CONTRAST  Technique:  Contiguous axial images were obtained from the base of the skull through the vertex without contrast.  Comparison: None.  Findings: The Prominence of the sulci, cisterns, and ventricles, in keeping with volume loss. There are subcortical and periventricular white matter hypodensities, a nonspecific finding most often seen with chronic microangiopathic changes.  There is no  evidence for acute hemorrhage, overt hydrocephalus, mass lesion, or abnormal extra-axial fluid collection.  No definite CT evidence for acute cortical based (large artery) infarction. The visualized paranasal sinuses and mastoid air cells are predominately clear.  No displaced calvarial fracture.  IMPRESSION: Mild volume loss and white matter changes.  No CT evidence of acute intracranial abnormality.   Original Report Authenticated By: Waneta Martins, M.D.    Ct Angio Chest W/cm &/or Wo Cm  07/04/2012  *RADIOLOGY REPORT*  Clinical Data: 78 year old female with syncope.  Abnormal D-dimer. Recent liver biopsy.  Diaphoresis, nausea.  CT ANGIOGRAPHY CHEST  Technique:  Multidetector CT imaging of the chest using the standard protocol during bolus administration  of intravenous contrast. Multiplanar reconstructed images including MIPs were obtained and reviewed to evaluate the vascular anatomy.  Contrast: OMNIPAQUE IOHEXOL 350 MG/ML SOLN  Comparison: Portable chest x-ray 07/03/2012.  CT abdomen 06/06/2012.  Findings: Good contrast bolus timing in the pulmonary arterial tree.  No focal filling defect identified in the pulmonary arterial tree to suggest the presence of acute pulmonary embolism.  Cardiomegaly.  No pericardial or pleural effusion. Negative visualized aorta except for atherosclerosis.  No mediastinal lymphadenopathy.  Negative thoracic inlet.  Major airways are patent.  There is dependent opacity bilaterally in the lungs plus scattered bilateral upper lobe predominant pulmonary nodules, the largest are irregular (right middle lobe nodule measuring 10 x 12 mm on series 5 image 64).  None of these nodules were present in 2007.  However, there is chronic subpleural reticular change in the left lung.  There could be a superimposed indistinct peripheral nodule on image 25.  No consolidation.  Large hypodense central liver lesion re-identified, better demonstrated in July due to the early phase of  contrast on this exam.  Additional smaller hypodense liver lesions partially re- identified.  Other visualized upper abdominal viscera are negative. No free fluid in the visualized upper abdomen.  No pneumoperitoneum identified.  Degenerative changes in the spine.  No acute or suspicious osseous lesion identified.  IMPRESSION: 1. No evidence of acute pulmonary embolus. 2.  Scattered bilateral upper lobe predominant irregular pulmonary nodules.  In light of malignant findings in the abdomen, pulmonary metastatic disease cannot be excluded, although disseminated early infection could have this appearance. 3.  Underlying chronic lung disease. 4.  Chronic cardiomegaly.  No mediastinal lymphadenopathy. 5.  Liver masses.  No upper abdominal free air or free fluid.   Original Report Authenticated By: Harley Hallmark, M.D.    Ct Abdomen Pelvis W Contrast  06/07/2012  *RADIOLOGY REPORT*  Clinical Data: Abdominal pain  CT ABDOMEN AND PELVIS WITH CONTRAST  Technique:  Multidetector CT imaging of the abdomen and pelvis was performed following the standard protocol during bolus administration of intravenous contrast.  Contrast: OMNIPAQUE IOHEXOL 300 MG/ML  SOLN  Comparison: 02/27/2008  Findings: Abnormal circumferential wall thickening in the region of the cecum is worrisome for adenocarcinoma.  Abnormal adenopathy in the cecal mesentery is present.  There is a bilobed soft tissue mass in the retroperitoneum posterior to the cecum measuring 2.8 x 1.1 cm worrisome for metastatic disease.  Several lesions are scattered throughout the liver.  The largest is at the dome in the anterior segment of the right lobe measuring 5.7 x 4.7 cm.  Effusions are scattered in both lobes of the liver. Benign appearing cysts are associated in the liver.  Bilateral pulmonary nodules are noted at the lung bases.  The largest is in the right lower lobe measuring 9 mm.  The  Pancreas is atrophic.  Spleen  is unremarkable.  Unremarkable  gallbladder.  Adrenal glands are diffusely prominent.  Small hypodensity in the left kidney is likely a tiny cyst.  No free fluid.  Sigmoid diverticulosis.  Apparent wall prominence of the sigmoid colon likely reflects decompression.  Severe degenerative disc disease in the lumbar spine.  IMPRESSION: Above findings most likely represent malignancy of the cecum with local retroperitoneal and nodal metastatic disease associated with metastatic disease in the liver and lungs.  Original Report Authenticated By: Donavan Burnet, M.D.   US Biopsy  07/03/2012  *RADIOLOGY REPORT*  Clinical history:78 year old with a colon lesion and suspicious liver lesions.  Findings are concerning for metastatic disease.  PROCEDURE(S): ULTRASOUND GUIDED LIVER LESION BIOPSY  Physician: Rachelle Hora. Henn, MD  Medications:Versed 2 mg, Fentanyl 50 mcg. A radiology nurse monitored the patient for moderate sedation.  Moderate sedation time:23 minutes  Fluoroscopy time: None  Procedure:Informed consent was obtained for a liver lesion biopsy. The liver was evaluated with ultrasound.  The right side of the abdomen was prepped and draped in a sterile fashion.  Skin and liver capsule were anesthetized with 1% lidocaine.  A 17 gauge needle was directed in the right hepatic lesion with ultrasound guidance.  Three core biopsies were obtained with an 18 gauge device.  Samples were placed in [place].  Findings:Heterogeneous hyperechoic area involving the right hepatic lobe.  Findings are compatible with the known lesion.  Needle was directed to this lesion and three core biopsies were obtained.  Complications: None  Impression:Ultrasound guided core biopsies of the right hepatic lesion.   Original Report Authenticated By: Richarda Overlie, M.D.    Dg Chest Portable 1 View  07/03/2012  *RADIOLOGY REPORT*  Clinical Data: Loss of consciousness  PORTABLE CHEST - 1 VIEW  Comparison: October 26, 2008.  Findings: Cardiomediastinal silhouette appears normal.  No  acute pulmonary disease is noted.  Bony thorax is intact.  IMPRESSION: No acute cardiopulmonary abnormality seen.   Original Report Authenticated By: Venita Sheffield., M.D.     Microbiology: No results found for this or any previous visit (from the past 240 hour(s)).   Labs: Basic Metabolic Panel:  Lab 07/04/12 4098 07/03/12 2240  NA 141 140  K 3.8 3.4*  CL 107 105  CO2 21 23  GLUCOSE 92 110*  BUN 7 10  CREATININE 0.60 0.62  CALCIUM 10.8* 10.9*  MG 1.9 --  PHOS 3.0 --   Liver Function Tests:  Lab 07/04/12 0615  AST 19  ALT 9  ALKPHOS 141*  BILITOT 0.3  PROT 7.3  ALBUMIN 3.1*   No results found for this basename: LIPASE:5,AMYLASE:5 in the last 168 hours No results found for this basename: AMMONIA:5 in the last 168 hours CBC:  Lab 07/04/12 0615 07/03/12 1315  WBC 6.4 5.8  NEUTROABS -- --  HGB 8.0* 8.5*  HCT 28.2* 29.9*  MCV 68.1* 67.5*  PLT 225 230   Cardiac Enzymes:  Lab 07/04/12 0615  CKTOTAL 96  CKMB 1.5  CKMBINDEX --  TROPONINI <0.30   BNP: No components found with this basename: POCBNP:5 CBG:  Lab 07/03/12 2001  GLUCAP 102*    Time coordinating discharge:  35 minutes  Signed:  Marinda Elk  [facility] 07/04/2012, 8:53 AM

## 2012-07-04 NOTE — Progress Notes (Signed)
Discharge review done with patient's grandaugther.   She acknowledged understanding of information provided.  .  Patient is stable and discharged home with family member. Ephriam Knuckles

## 2012-07-04 NOTE — ED Notes (Signed)
Called CT to notify them on new IV placement.

## 2012-07-04 NOTE — H&P (Cosign Needed)
PCP:   Alva Garnet., MD    Chief Complaint:   syncope  HPI: Kiara Watkins is a 78 y.o. female   has a past medical history of CAD (coronary artery disease) (12/09); HTN (hypertension); Hyperlipidemia; GERD (gastroesophageal reflux disease); Chronic back pain; and Kidney stone.   Presented with  She had a biopsy done last AM for liver mass. She has not eaten all day.  After that she was trying to break her fast at the restaurant. States she smelled something bad felt lightheaded and passed out.  Per family she  patient appeared  to be unconscious for about 20 min. She states she was aware of things around her and her family trying to help but felt too weak to answer. No seizulre like activity no chest pain or shortness of breath.  she was nauseous and had some vomiting. In emergency department she was found to be orthostatic and hypotensive with systolic blood pressure down to 81. After the fluids this has improved with the latest blood pressure 167/80. She is currently feeling back to baseline. A CT angiogram chest showed no evidence of pulmonary embolism. There is some possible long metastatic disease. Results of her liver biopsy I still not available.   of note she had had an normal stress test done by Charlton Haws in January 2013  Concerning her possible metastatic disease this was first found in the end of July after ER visit for abdominal pain. A CT scan at that time also found abnormal cecum mass worrisome for adenocarcinoma with metastatic disease.  patient apparently has been scheduled to follow up of oncology at [facility] in [facility] Dr. Allyne Gee and also scheduled to have a colonoscopy done by Dr. Loreta Ave  Review of Systems:    Pertinent positives include:nausea, vomiting, fatigue,   Constitutional:  No weight loss, night sweats, Fevers, chills,weight loss  HEENT:  No headaches, Difficulty swallowing,Tooth/dental problems,Sore throat,  No sneezing, itching, ear ache, nasal  congestion, post nasal drip,  Cardio-vascular:  No chest pain, Orthopnea, PND, anasarca, dizziness, palpitations.no Bilateral lower extremity swelling  GI:  No heartburn, indigestion, abdominal pain,  diarrhea, change in bowel habits, loss of appetite, melena, blood in stool, hematemesis Resp:  no shortness of breath at rest. No dyspnea on exertion, No excess mucus, no productive cough, No non-productive cough, No coughing up of blood.No change in color of mucus.No wheezing. Skin:  no rash or lesions. No jaundice GU:  no dysuria, change in color of urine, no urgency or frequency. No straining to urinate.  No flank pain.  Musculoskeletal:  No joint pain or no joint swelling. No decreased range of motion. No back pain.  Psych:  No change in mood or affect. No depression or anxiety. No memory loss.  Neuro: no localizing neurological complaints, no tingling, no weakness, no double vision, no gait abnormality, no slurred speech, no confusion  Otherwise ROS are negative except for above, 10 systems were reviewed  Past Medical History: Past Medical History  Diagnosis Date  . CAD (coronary artery disease) 12/09    CAD status post successful percutanous coronary intervention and steneting of the first obtuse marginal with placement of a 2.75 x 23 mm XIENCE V drug-eluting   . HTN (hypertension)   . Hyperlipidemia   . GERD (gastroesophageal reflux disease)   . Chronic back pain   . Kidney stone    Past Surgical History  Procedure Date  . Liver biopsy 07/04/2012  . Cardiac catheterization 2006  stent     Medications: Prior to Admission medications   Medication Sig Start Date End Date Taking? Authorizing Provider  amLODipine-benazepril (LOTREL) 10-20 MG per capsule Take 1 capsule by mouth daily.     Yes Historical Provider, MD  aspirin 81 MG tablet Take 81 mg by mouth daily.     Yes Historical Provider, MD  calcium-vitamin D (OSCAL WITH D) 500-200 MG-UNIT per tablet Take 1 tablet by  mouth daily.     Yes Historical Provider, MD  ibuprofen (ADVIL,MOTRIN) 200 MG tablet Take 200 mg by mouth every 6 (six) hours as needed. For headache   Yes Historical Provider, MD  latanoprost (XALATAN) 0.005 % ophthalmic solution Place 1 drop into both eyes at bedtime.    Yes Historical Provider, MD  nitroGLYCERIN (NITROSTAT) 0.4 MG SL tablet Place 0.4 mg under the tongue every 5 (five) minutes as needed. For chest pain   Yes Historical Provider, MD  omeprazole (PRILOSEC) 20 MG capsule Take 20 mg by mouth daily.     Yes Historical Provider, MD  Vitamin D, Ergocalciferol, (DRISDOL) 50000 UNITS CAPS Take 50,000 Units by mouth 2 (two) times a week.   Yes Historical Provider, MD    Allergies:  No Known Allergies  Social History:  Ambulatory  independently  Lives at  Home with family   reports that she has quit smoking. She does not have any smokeless tobacco history on file. She reports that she does not drink alcohol or use illicit drugs.   Family History: family history is negative for Coronary artery disease.    Physical Exam: Patient Vitals for the past 24 hrs:  BP Temp Temp src Pulse Resp SpO2 Height Weight  07/04/12 0138 167/80 mmHg 98 F (36.7 C) Oral 83  17  98 % 5\' 2"  (1.575 m) 59.92 kg (132 lb 1.6 oz)  07/04/12 0035 130/65 mmHg - Oral 82  20  97 % - -  07/03/12 2300 166/144 mmHg - - 94  18  100 % - -  07/03/12 2230 146/68 mmHg - - 79  19  99 % - -  07/03/12 2215 145/60 mmHg 97.8 F (36.6 C) Oral 76  19  97 % - -  07/03/12 2130 138/65 mmHg - - 80  24  100 % - -  07/03/12 2045 122/44 mmHg - - 56  16  100 % - -  07/03/12 2032 87/71 mmHg - - 70  - - - -  07/03/12 2031 105/51 mmHg - - 66  - - - -  07/03/12 2030 138/52 mmHg - - 59  - - - -  07/03/12 2015 137/53 mmHg - - 67  - 96 % - -  07/03/12 1951 129/74 mmHg - - 63  16  93 % - -    1. General:  in No Acute distress 2. Psychological: Alert and  Oriented 3. Head/ENT:    Dry Mucous Membranes                          Head  Non traumatic, neck supple                          Normal  Dentition 4. SKIN:  decreased Skin turgor,  Skin clean Dry and intact no rash 5. Heart: Regular rate and rhythm no Murmur, Rub or gallop 6. Lungs: Clear to auscultation bilaterally, no wheezes or crackles   7. Abdomen:  Soft, non-tender, Non distended 8. Lower extremities: no clubbing, cyanosis, or edema 9. Neurologically Grossly intact, moving all 4 extremities equally 10. MSK: Normal range of motion  body mass index is 24.16 kg/(m^2).   Labs on Admission:   [facility] 07/03/12 2240  NA 140  K 3.4*  CL 105  CO2 23  GLUCOSE 110*  BUN 10  CREATININE 0.62  CALCIUM 10.9*  MG --  PHOS --   No results found for this basename: AST:2,ALT:2,ALKPHOS:2,BILITOT:2,PROT:2,ALBUMIN:2 in the last 72 hours No results found for this basename: LIPASE:2,AMYLASE:2 in the last 72 hours  Basename 07/03/12 1315  WBC 5.8  NEUTROABS --  HGB 8.5*  HCT 29.9*  MCV 67.5*  PLT 230   No results found for this basename: CKTOTAL:3,CKMB:3,CKMBINDEX:3,TROPONINI:3 in the last 72 hours No results found for this basename: TSH,T4TOTAL,FREET3,T3FREE,THYROIDAB in the last 72 hours No results found for this basename: VITAMINB12:2,FOLATE:2,FERRITIN:2,TIBC:2,IRON:2,RETICCTPCT:2 in the last 72 hours No results found for this basename: HGBA1C    Estimated Creatinine Clearance: 42.1 ml/min (by C-G formula based on Cr of 0.62). ABG    Component Value Date/Time   TCO2 24 04/24/2010 1611     Lab Results  Component Value Date   DDIMER 6.69* 07/03/2012     Other results:  I have pearsonaly reviewed this: ECG REPORT  Rate:59   Rhythm:  first-degree AV block  ST&T Change:  nonspecific ST segment changes in lead V6 and V5     Radiological Exams on Admission: Ct Head Wo Contrast  07/03/2012  *RADIOLOGY REPORT*  Clinical Data: Syncope, dizziness.  CT HEAD WITHOUT CONTRAST  Technique:  Contiguous axial images were obtained from the base of the skull  through the vertex without contrast.  Comparison: None.  Findings: The Prominence of the sulci, cisterns, and ventricles, in keeping with volume loss. There are subcortical and periventricular white matter hypodensities, a nonspecific finding most often seen with chronic microangiopathic changes.  There is no evidence for acute hemorrhage, overt hydrocephalus, mass lesion, or abnormal extra-axial fluid collection.  No definite CT evidence for acute cortical based (large artery) infarction. The visualized paranasal sinuses and mastoid air cells are predominately clear.  No displaced calvarial fracture.  IMPRESSION: Mild volume loss and white matter changes.  No CT evidence of acute intracranial abnormality.   Original Report Authenticated By: Waneta Martins, M.D.    Ct Angio Chest W/cm &/or Wo Cm  07/04/2012  *RADIOLOGY REPORT*  Clinical Data: 78 year old female with syncope.  Abnormal D-dimer. Recent liver biopsy.  Diaphoresis, nausea.  CT ANGIOGRAPHY CHEST  Technique:  Multidetector CT imaging of the chest using the standard protocol during bolus administration of intravenous contrast. Multiplanar reconstructed images including MIPs were obtained and reviewed to evaluate the vascular anatomy.  Contrast: OMNIPAQUE IOHEXOL 350 MG/ML SOLN  Comparison: Portable chest x-ray 07/03/2012.  CT abdomen 06/06/2012.  Findings: Good contrast bolus timing in the pulmonary arterial tree.  No focal filling defect identified in the pulmonary arterial tree to suggest the presence of acute pulmonary embolism.  Cardiomegaly.  No pericardial or pleural effusion. Negative visualized aorta except for atherosclerosis.  No mediastinal lymphadenopathy.  Negative thoracic inlet.  Major airways are patent.  There is dependent opacity bilaterally in the lungs plus scattered bilateral upper lobe predominant pulmonary nodules, the largest are irregular (right middle lobe nodule measuring 10 x 12 mm on series 5 image 64).  None of  these nodules were present in 2007.  However, there is chronic subpleural reticular change in the  left lung.  There could be a superimposed indistinct peripheral nodule on image 25.  No consolidation.  Large hypodense central liver lesion re-identified, better demonstrated in July due to the early phase of contrast on this exam.  Additional smaller hypodense liver lesions partially re- identified.  Other visualized upper abdominal viscera are negative. No free fluid in the visualized upper abdomen.  No pneumoperitoneum identified.  Degenerative changes in the spine.  No acute or suspicious osseous lesion identified.  IMPRESSION: 1. No evidence of acute pulmonary embolus. 2.  Scattered bilateral upper lobe predominant irregular pulmonary nodules.  In light of malignant findings in the abdomen, pulmonary metastatic disease cannot be excluded, although disseminated early infection could have this appearance. 3.  Underlying chronic lung disease. 4.  Chronic cardiomegaly.  No mediastinal lymphadenopathy. 5.  Liver masses.  No upper abdominal free air or free fluid.   Original Report Authenticated By: Harley Hallmark, M.D.    US Biopsy  07/03/2012  *RADIOLOGY REPORT*  Clinical history:78 year old with a colon lesion and suspicious liver lesions.  Findings are concerning for metastatic disease.  PROCEDURE(S): ULTRASOUND GUIDED LIVER LESION BIOPSY  Physician: Rachelle Hora. Henn, MD  Medications:Versed 2 mg, Fentanyl 50 mcg. A radiology nurse monitored the patient for moderate sedation.  Moderate sedation time:23 minutes  Fluoroscopy time: None  Procedure:Informed consent was obtained for a liver lesion biopsy. The liver was evaluated with ultrasound.  The right side of the abdomen was prepped and draped in a sterile fashion.  Skin and liver capsule were anesthetized with 1% lidocaine.  A 17 gauge needle was directed in the right hepatic lesion with ultrasound guidance.  Three core biopsies were obtained with an 18 gauge device.   Samples were placed in [place].  Findings:Heterogeneous hyperechoic area involving the right hepatic lobe.  Findings are compatible with the known lesion.  Needle was directed to this lesion and three core biopsies were obtained.  Complications: None  Impression:Ultrasound guided core biopsies of the right hepatic lesion.   Original Report Authenticated By: Richarda Overlie, M.D.    Dg Chest Portable 1 View  07/03/2012  *RADIOLOGY REPORT*  Clinical Data: Loss of consciousness  PORTABLE CHEST - 1 VIEW  Comparison: October 26, 2008.  Findings: Cardiomediastinal silhouette appears normal.  No acute pulmonary disease is noted.  Bony thorax is intact.  IMPRESSION: No acute cardiopulmonary abnormality seen.   Original Report Authenticated By: Venita Sheffield., M.D.     Chart has been reviewed  Assessment/Plan   this is 78 year old female with prior history of coronary artery disease presents with syncope in a setting of orthostasis and poor by mouth intake after liver biopsy for suspicious metastatic disease.  Present on Admission:  .Syncope and collapse - this is the likely hypertension induced secondary to orthostasis secondary to poor by mouth intake. Her blood pressure is now improved  She is feeling back to normal. Given her advanced age and history of coronary disease we'll admit to telemetry. Cycle cardiac markers. Serial EKG. Repeat echo gram. 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 CT scan of the chest today also showed possible pulmonary involvement. Patient is supposed to followup at [facility] with an oncologist.    Prophylaxis: SCD Protonix  CODE STATUS: FULL CODE  Other plan as per orders.  I have spent a total of 55 min  on this admission  Larsen Zettel 07/04/2012, 3:12 AM

## 2012-07-05 NOTE — ED Provider Notes (Signed)
I saw and evaluated the patient, reviewed the resident's note and I agree with the findings and plan.   Kiara Chad, MD 07/05/12 [phone]

## 2013-12-16 IMAGING — CT CT ANGIO CHEST
2 of 6 series · 18 of 46 positions shown · IV contrast (APPLIED)
Comparison: Portable chest x-ray 07/03/2012.  CT abdomen
06/06/2012.

CLINICAL DATA: 85-year-old female with syncope.  Abnormal D-dimer.
Recent liver biopsy.  Diaphoresis, nausea.

CT ANGIOGRAPHY CHEST
TECHNIQUE: Multidetector CT imaging of the chest using the
standard protocol during bolus administration of intravenous
contrast. Multiplanar reconstructed images including MIPs were
obtained and reviewed to evaluate the vascular anatomy.
Contrast: 100mL OMNIPAQUE IOHEXOL 350 MG/ML SOLN

[Series 6: pulm embolism 1.0 b25f thin · axial · 0.58mm/px · z∈[+1157,+1388]mm · 15 of 255 slices shown]
[im 12/255  lung]
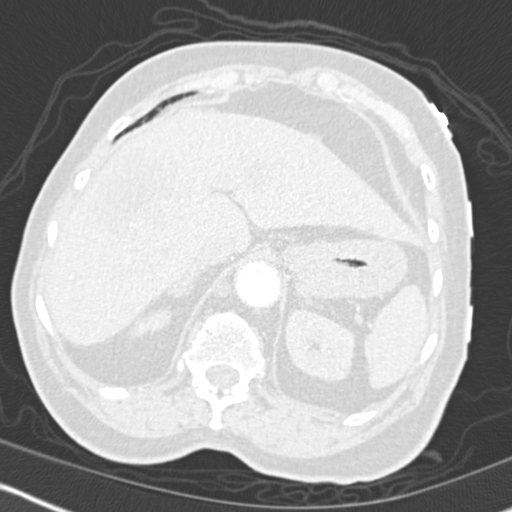
[im 34/255  soft-tissue]
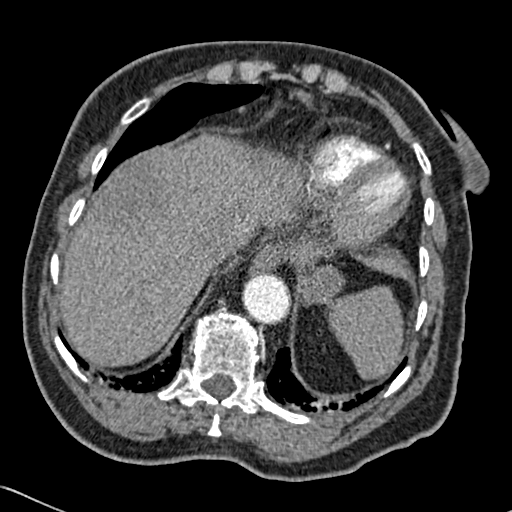
[im 45/255  lung]
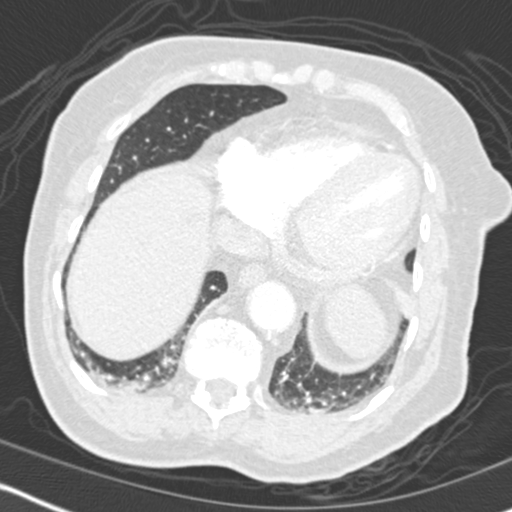
[im 67/255  soft-tissue]
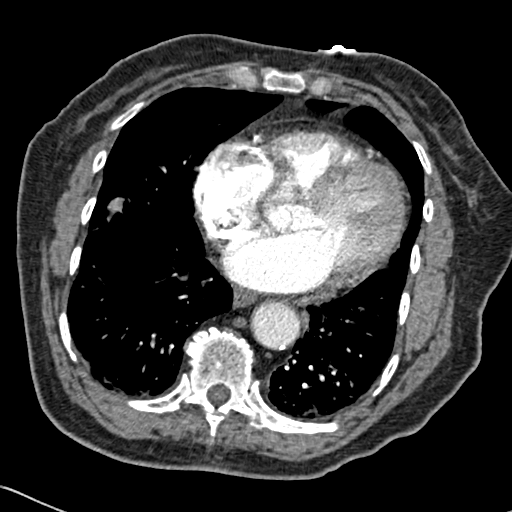
[im 78/255  lung]
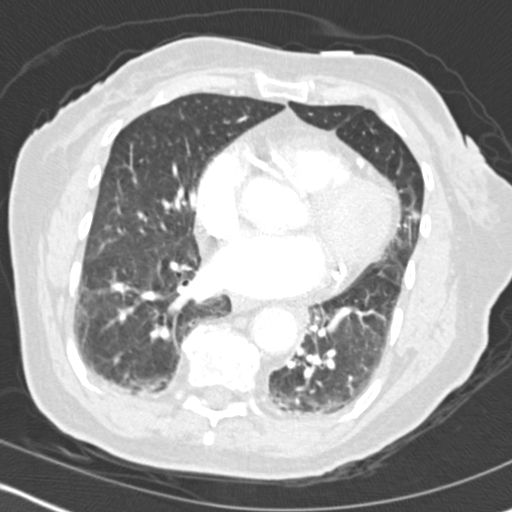
[im 100/255  soft-tissue]
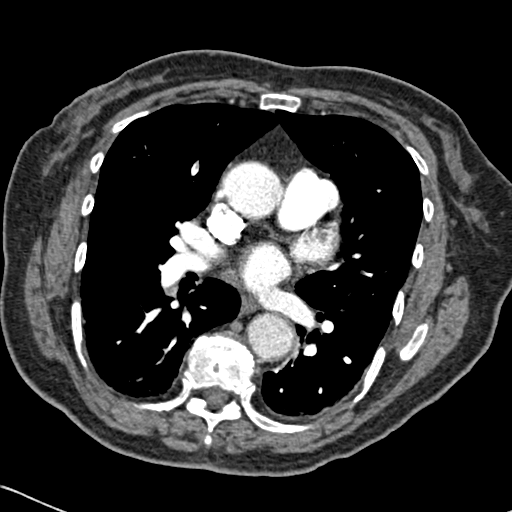
[im 111/255  lung]
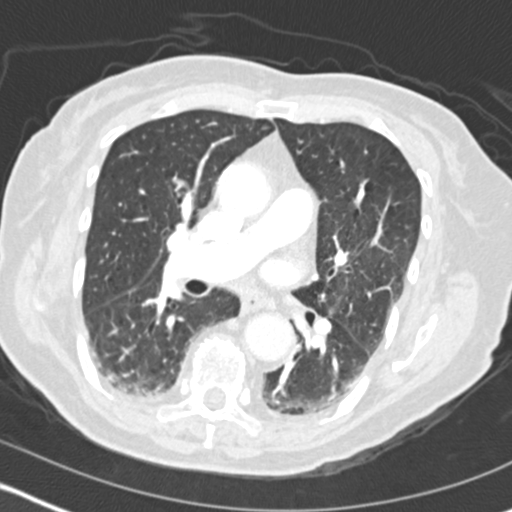
[im 133/255  soft-tissue]
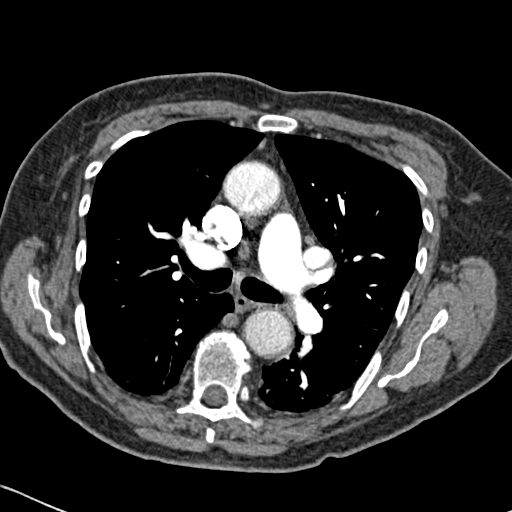
[im 144/255  lung]
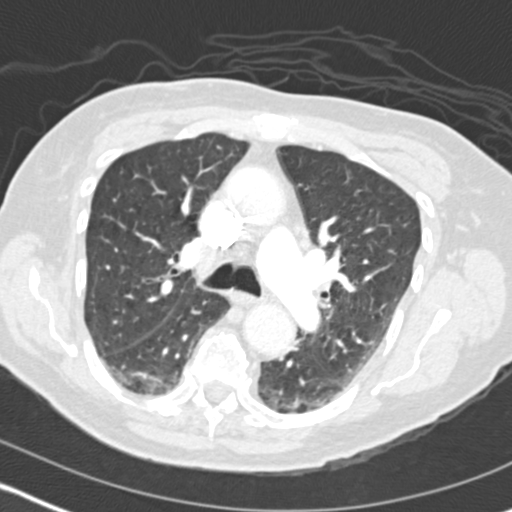
[im 155/255  soft-tissue]
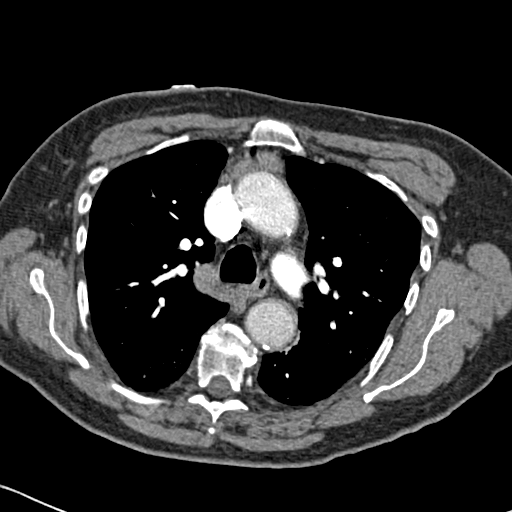
[im 177/255  lung]
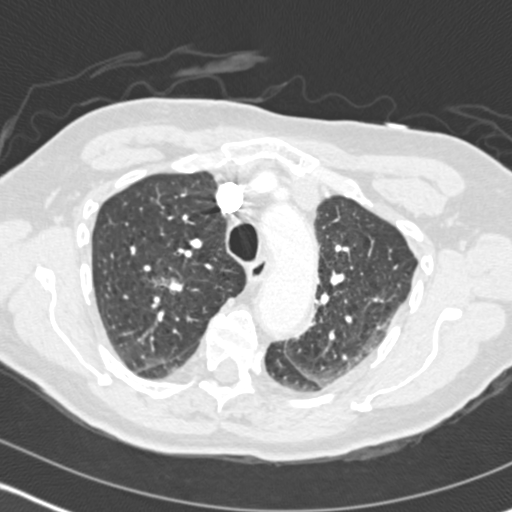
[im 188/255  soft-tissue]
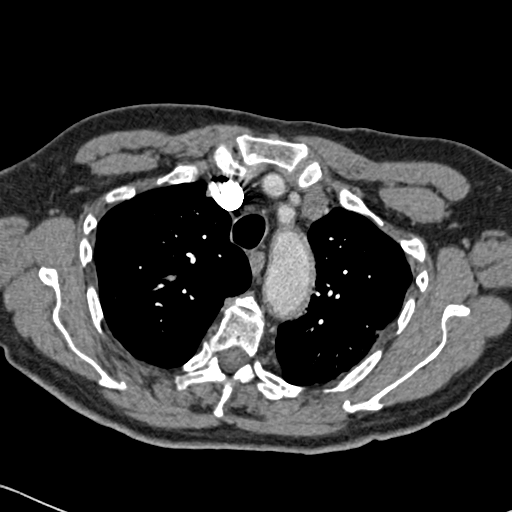
[im 210/255  lung]
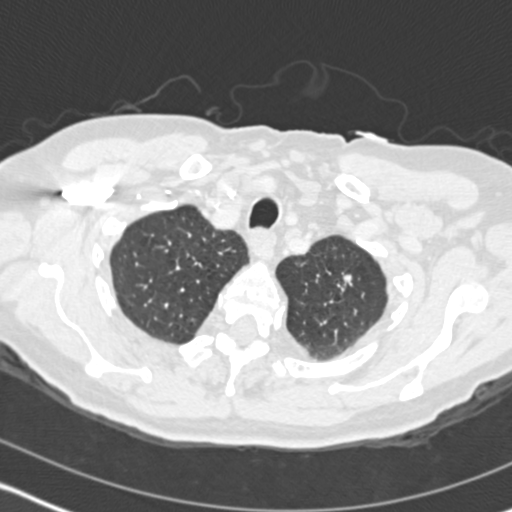
[im 221/255  soft-tissue]
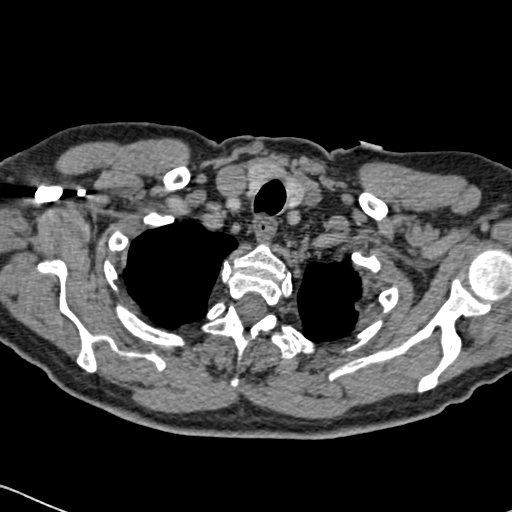
[im 243/255  lung]
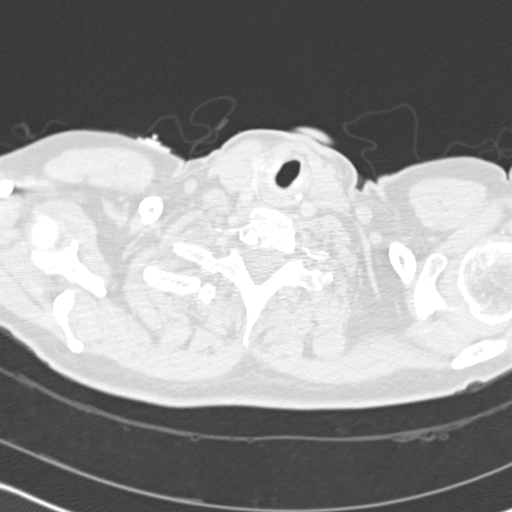

[Series 602: cor · coronal · 0.58mm/px · 3 of 101 slices shown]
[im 26/101  soft-tissue]
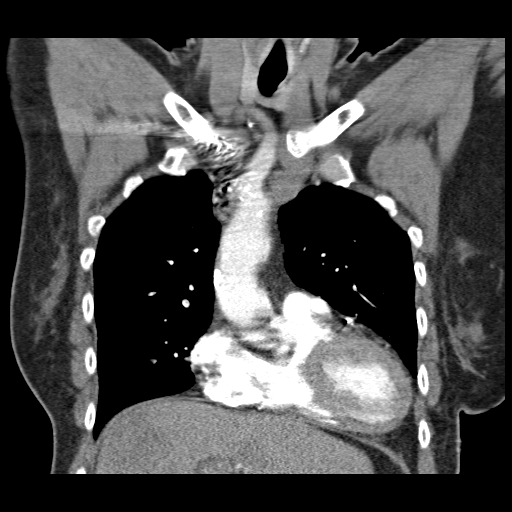
[im 51/101  soft-tissue]
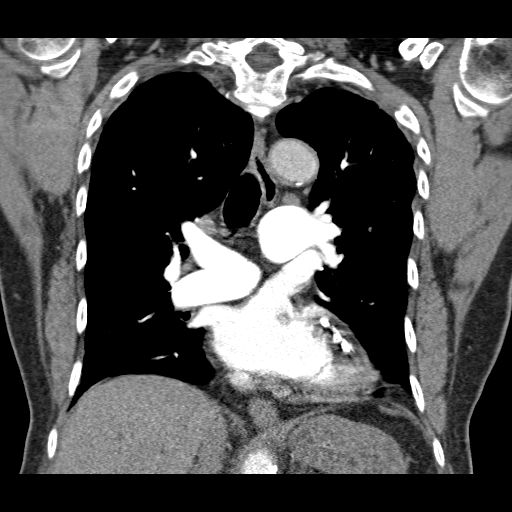
[im 76/101  soft-tissue]
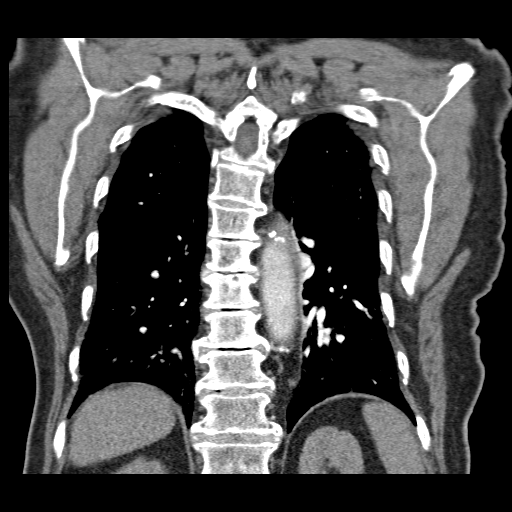

[18 of 46 positions shown; findings below may reference images not displayed]

FINDINGS: Good contrast bolus timing in the pulmonary arterial
tree.  No focal filling defect identified in the pulmonary arterial
tree to suggest the presence of acute pulmonary embolism.

Cardiomegaly.  No pericardial or pleural effusion. Negative
visualized aorta except for atherosclerosis.  No mediastinal
lymphadenopathy.  Negative thoracic inlet.

Major airways are patent.  There is dependent opacity bilaterally
in the lungs plus scattered bilateral upper lobe predominant
pulmonary nodules, the largest are irregular (right middle lobe
nodule measuring 10 x 12 mm on series 5 image 64).  None of these
nodules were present in 8666.  However, there is chronic subpleural
reticular change in the left lung.  There could be a superimposed
indistinct peripheral nodule on image 25.  No consolidation.

Large hypodense central liver lesion re-identified, better
demonstrated in [REDACTED] due to the early phase of contrast on this
exam.  Additional smaller hypodense liver lesions partially re-
identified.  Other visualized upper abdominal viscera are negative.
No free fluid in the visualized upper abdomen.  No pneumoperitoneum
identified.

Degenerative changes in the spine.  No acute or suspicious osseous
lesion identified.
IMPRESSION: 1. No evidence of acute pulmonary embolus.
2.  Scattered bilateral upper lobe predominant irregular pulmonary
nodules.  In light of malignant findings in the abdomen, pulmonary
metastatic disease cannot be excluded, although disseminated early
infection could have this appearance.
3.  Underlying chronic lung disease.
4.  Chronic cardiomegaly.  No mediastinal lymphadenopathy.
5.  Liver masses.  No upper abdominal free air or free fluid.

## 2014-03-06 ENCOUNTER — Inpatient Hospital Stay (HOSPITAL_COMMUNITY)
Admission: EM | Admit: 2014-03-06 | Discharge: 2014-03-11 | DRG: 374 | Disposition: A | Discharge status: Home Health | Payer: PRIVATE HEALTH INSURANCE | Attending: Internal Medicine | Admitting: Internal Medicine

## 2014-03-06 ENCOUNTER — Encounter (HOSPITAL_COMMUNITY): Payer: Self-pay | Admitting: Emergency Medicine

## 2014-03-06 DIAGNOSIS — K7200 Acute and subacute hepatic failure without coma: Secondary | ICD-10-CM | POA: Diagnosis present

## 2014-03-06 DIAGNOSIS — R627 Adult failure to thrive: Secondary | ICD-10-CM | POA: Diagnosis present

## 2014-03-06 DIAGNOSIS — N390 Urinary tract infection, site not specified: Secondary | ICD-10-CM | POA: Diagnosis present

## 2014-03-06 DIAGNOSIS — K219 Gastro-esophageal reflux disease without esophagitis: Secondary | ICD-10-CM | POA: Diagnosis present

## 2014-03-06 DIAGNOSIS — E782 Mixed hyperlipidemia: Secondary | ICD-10-CM

## 2014-03-06 DIAGNOSIS — D630 Anemia in neoplastic disease: Secondary | ICD-10-CM | POA: Diagnosis present

## 2014-03-06 DIAGNOSIS — D72829 Elevated white blood cell count, unspecified: Secondary | ICD-10-CM | POA: Diagnosis present

## 2014-03-06 DIAGNOSIS — R55 Syncope and collapse: Secondary | ICD-10-CM

## 2014-03-06 DIAGNOSIS — C787 Secondary malignant neoplasm of liver and intrahepatic bile duct: Secondary | ICD-10-CM | POA: Diagnosis present

## 2014-03-06 DIAGNOSIS — D649 Anemia, unspecified: Secondary | ICD-10-CM

## 2014-03-06 DIAGNOSIS — Z681 Body mass index (BMI) 19 or less, adult: Secondary | ICD-10-CM

## 2014-03-06 DIAGNOSIS — E785 Hyperlipidemia, unspecified: Secondary | ICD-10-CM | POA: Diagnosis present

## 2014-03-06 DIAGNOSIS — K769 Liver disease, unspecified: Secondary | ICD-10-CM | POA: Diagnosis present

## 2014-03-06 DIAGNOSIS — C189 Malignant neoplasm of colon, unspecified: Principal | ICD-10-CM | POA: Diagnosis present

## 2014-03-06 DIAGNOSIS — Z87442 Personal history of urinary calculi: Secondary | ICD-10-CM

## 2014-03-06 DIAGNOSIS — I2510 Atherosclerotic heart disease of native coronary artery without angina pectoris: Secondary | ICD-10-CM | POA: Diagnosis present

## 2014-03-06 DIAGNOSIS — Z79899 Other long term (current) drug therapy: Secondary | ICD-10-CM

## 2014-03-06 DIAGNOSIS — E43 Unspecified severe protein-calorie malnutrition: Secondary | ICD-10-CM | POA: Insufficient documentation

## 2014-03-06 DIAGNOSIS — E860 Dehydration: Secondary | ICD-10-CM | POA: Diagnosis present

## 2014-03-06 DIAGNOSIS — IMO0002 Reserved for concepts with insufficient information to code with codable children: Secondary | ICD-10-CM

## 2014-03-06 DIAGNOSIS — Z515 Encounter for palliative care: Secondary | ICD-10-CM

## 2014-03-06 DIAGNOSIS — Z66 Do not resuscitate: Secondary | ICD-10-CM | POA: Diagnosis present

## 2014-03-06 DIAGNOSIS — I10 Essential (primary) hypertension: Secondary | ICD-10-CM | POA: Diagnosis present

## 2014-03-06 DIAGNOSIS — I959 Hypotension, unspecified: Secondary | ICD-10-CM | POA: Diagnosis present

## 2014-03-06 DIAGNOSIS — N19 Unspecified kidney failure: Secondary | ICD-10-CM

## 2014-03-06 DIAGNOSIS — G9341 Metabolic encephalopathy: Secondary | ICD-10-CM | POA: Diagnosis present

## 2014-03-06 DIAGNOSIS — N179 Acute kidney failure, unspecified: Secondary | ICD-10-CM | POA: Diagnosis present

## 2014-03-06 DIAGNOSIS — Z87891 Personal history of nicotine dependence: Secondary | ICD-10-CM

## 2014-03-06 NOTE — ED Notes (Signed)
Bed: RESA Expected date:  Expected time:  Means of arrival:  Comments: EMS 80 yo shallow respirations

## 2014-03-06 NOTE — ED Notes (Addendum)
Per EMS patient is stage 4 colon cancer. Patient was reported to having shallow respirations. Patient was on NRB when EMS arrived, SPO2 100%. Patient is DNR, per fire dept on scene, family wants DNR disregarded. Patient would not answer EMS questions, will not respond to EMS. Patient arrived with PORTaCATH accessed, no cap attached, air noted to tubing. Port de-accessed on arrival due to question of sterility, and re-accessed.

## 2014-03-06 NOTE — ED Notes (Signed)
Patient arrived with EMS, called was made for resp distress and shallow respirations. Fire Dept was first responders and report to EMS patient has a portable DNR, however family would not allow EMS to transport with this documentation, states they want DNR rescinded. Patient was on NRB when EMS arrived, SPO2 100%. Patient with shallow respirations. Patient arrived with PORTaCATH accessed, cap missing, and air noted to be in line, port was de-accessed and re-accessed for infection prevention. Patient also has a 16Fr Foley in place on arrival, tea colored urine noted to be draining from bladder. Per EMS report patient is wearing a fentanyl patch that was placed on 03/05/14. Patient non responsive to staff at this time.

## 2014-03-07 ENCOUNTER — Encounter (HOSPITAL_COMMUNITY): Payer: Self-pay | Admitting: Internal Medicine

## 2014-03-07 DIAGNOSIS — Z515 Encounter for palliative care: Secondary | ICD-10-CM

## 2014-03-07 DIAGNOSIS — D649 Anemia, unspecified: Secondary | ICD-10-CM | POA: Diagnosis present

## 2014-03-07 DIAGNOSIS — N19 Unspecified kidney failure: Secondary | ICD-10-CM | POA: Diagnosis present

## 2014-03-07 DIAGNOSIS — IMO0002 Reserved for concepts with insufficient information to code with codable children: Secondary | ICD-10-CM | POA: Diagnosis present

## 2014-03-07 DIAGNOSIS — R627 Adult failure to thrive: Secondary | ICD-10-CM

## 2014-03-07 LAB — CBC WITH DIFFERENTIAL/PLATELET
BASOS ABS: 0 10*3/uL (ref 0.0–0.1)
BASOS PCT: 0 % (ref 0–1)
BASOS PCT: 0 % (ref 0–1)
Basophils Absolute: 0 10*3/uL (ref 0.0–0.1)
EOS PCT: 0 % (ref 0–5)
Eosinophils Absolute: 0 10*3/uL (ref 0.0–0.7)
Eosinophils Absolute: 0 10*3/uL (ref 0.0–0.7)
Eosinophils Relative: 0 % (ref 0–5)
HEMATOCRIT: 33.1 % — AB (ref 36.0–46.0)
HEMATOCRIT: 34 % — AB (ref 36.0–46.0)
HEMOGLOBIN: 10.8 g/dL — AB (ref 12.0–15.0)
HEMOGLOBIN: 10.9 g/dL — AB (ref 12.0–15.0)
LYMPHS PCT: 2 % — AB (ref 12–46)
Lymphocytes Relative: 3 % — ABNORMAL LOW (ref 12–46)
Lymphs Abs: 0.3 10*3/uL — ABNORMAL LOW (ref 0.7–4.0)
Lymphs Abs: 0.4 10*3/uL — ABNORMAL LOW (ref 0.7–4.0)
MCH: 26.5 pg (ref 26.0–34.0)
MCH: 26.7 pg (ref 26.0–34.0)
MCHC: 31.8 g/dL (ref 30.0–36.0)
MCHC: 32.9 g/dL (ref 30.0–36.0)
MCV: 80.5 fL (ref 78.0–100.0)
MCV: 84 fL (ref 78.0–100.0)
MONO ABS: 0.5 10*3/uL (ref 0.1–1.0)
MONOS PCT: 3 % (ref 3–12)
MONOS PCT: 4 % (ref 3–12)
Monocytes Absolute: 0.4 10*3/uL (ref 0.1–1.0)
NEUTROS ABS: 11.1 10*3/uL — AB (ref 1.7–7.7)
NRBC: 1 /100{WBCs} — AB
Neutro Abs: 13.5 10*3/uL — ABNORMAL HIGH (ref 1.7–7.7)
Neutrophils Relative %: 92 % — ABNORMAL HIGH (ref 43–77)
Neutrophils Relative %: 95 % — ABNORMAL HIGH (ref 43–77)
Platelets: 249 10*3/uL (ref 150–400)
Platelets: 286 10*3/uL (ref 150–400)
RBC: 4.05 MIL/uL (ref 3.87–5.11)
RBC: 4.11 MIL/uL (ref 3.87–5.11)
RDW: 21.1 % — ABNORMAL HIGH (ref 11.5–15.5)
RDW: 21.6 % — AB (ref 11.5–15.5)
WBC: 12.1 10*3/uL — ABNORMAL HIGH (ref 4.0–10.5)
WBC: 14.2 10*3/uL — AB (ref 4.0–10.5)

## 2014-03-07 LAB — URINALYSIS, ROUTINE W REFLEX MICROSCOPIC
Glucose, UA: NEGATIVE mg/dL
KETONES UR: NEGATIVE mg/dL
Nitrite: POSITIVE — AB
PH: 5 (ref 5.0–8.0)
Protein, ur: 30 mg/dL — AB
SPECIFIC GRAVITY, URINE: 1.013 (ref 1.005–1.030)
UROBILINOGEN UA: 1 mg/dL (ref 0.0–1.0)

## 2014-03-07 LAB — URINE MICROSCOPIC-ADD ON

## 2014-03-07 LAB — COMPREHENSIVE METABOLIC PANEL
ALBUMIN: 1.6 g/dL — AB (ref 3.5–5.2)
ALK PHOS: 647 U/L — AB (ref 39–117)
ALT: 125 U/L — AB (ref 0–35)
ALT: 142 U/L — ABNORMAL HIGH (ref 0–35)
AST: 412 U/L — AB (ref 0–37)
AST: 525 U/L — ABNORMAL HIGH (ref 0–37)
Albumin: 1.6 g/dL — ABNORMAL LOW (ref 3.5–5.2)
Alkaline Phosphatase: 657 U/L — ABNORMAL HIGH (ref 39–117)
BUN: 73 mg/dL — ABNORMAL HIGH (ref 6–23)
BUN: 71 mg/dL — AB (ref 6–23)
CALCIUM: 10.1 mg/dL (ref 8.4–10.5)
CALCIUM: 10.8 mg/dL — AB (ref 8.4–10.5)
CO2: 23 mEq/L (ref 19–32)
CO2: 22 mEq/L (ref 19–32)
Chloride: 106 mEq/L (ref 96–112)
Chloride: 105 mEq/L (ref 96–112)
Creatinine, Ser: 2.1 mg/dL — ABNORMAL HIGH (ref 0.50–1.10)
Creatinine, Ser: 2.11 mg/dL — ABNORMAL HIGH (ref 0.50–1.10)
GFR calc Af Amer: 24 mL/min — ABNORMAL LOW (ref >90)
GFR calc non Af Amer: 21 mL/min — ABNORMAL LOW (ref >90)
GFR calc non Af Amer: 20 mL/min — ABNORMAL LOW (ref >90)
GFR, EST AFRICAN AMERICAN: 24 mL/min — AB (ref >90)
GLUCOSE: 109 mg/dL — AB (ref 70–99)
GLUCOSE: 57 mg/dL — AB (ref 70–99)
POTASSIUM: 4.5 meq/L (ref 3.7–5.3)
Potassium: 4.1 mEq/L (ref 3.7–5.3)
SODIUM: 144 meq/L (ref 137–147)
Sodium: 143 mEq/L (ref 137–147)
TOTAL PROTEIN: 6 g/dL (ref 6.0–8.3)
TOTAL PROTEIN: 6.2 g/dL (ref 6.0–8.3)
Total Bilirubin: 9.1 mg/dL — ABNORMAL HIGH (ref 0.3–1.2)
Total Bilirubin: 9.2 mg/dL — ABNORMAL HIGH (ref 0.3–1.2)

## 2014-03-07 LAB — GLUCOSE, CAPILLARY
GLUCOSE-CAPILLARY: 115 mg/dL — AB (ref 70–99)
Glucose-Capillary: 81 mg/dL (ref 70–99)
Glucose-Capillary: 130 mg/dL — ABNORMAL HIGH (ref 70–99)
Glucose-Capillary: 138 mg/dL — ABNORMAL HIGH (ref 70–99)

## 2014-03-07 LAB — BLOOD GAS, VENOUS
ACID-BASE DEFICIT: 2.8 mmol/L — AB (ref 0.0–2.0)
Bicarbonate: 21.4 mEq/L (ref 20.0–24.0)
O2 SAT: 76.6 %
PATIENT TEMPERATURE: 98.6
PO2 VEN: 42.9 mmHg (ref 30.0–45.0)
TCO2: 19.7 mmol/L (ref 0–100)
pCO2, Ven: 37.3 mmHg — ABNORMAL LOW (ref 45.0–50.0)
pH, Ven: 7.377 — ABNORMAL HIGH (ref 7.250–7.300)

## 2014-03-07 LAB — AMMONIA: Ammonia: 36 umol/L (ref 11–60)

## 2014-03-07 MED ORDER — LATANOPROST 0.005 % OP SOLN
1.0000 [drp] | Freq: Every day | OPHTHALMIC | Status: DC
  Administered 2014-03-07 – 2014-03-10 (×4): 1 [drp] via OPHTHALMIC
  Filled 2014-03-07 (×2): qty 2.5

## 2014-03-07 MED ORDER — ENOXAPARIN SODIUM 30 MG/0.3ML ~~LOC~~ SOLN
30.0000 mg | SUBCUTANEOUS | Status: DC
  Administered 2014-03-07 – 2014-03-09 (×3): 30 mg via SUBCUTANEOUS
  Filled 2014-03-07 (×4): qty 0.3

## 2014-03-07 MED ORDER — MORPHINE SULFATE 2 MG/ML IJ SOLN
0.5000 mg | INTRAMUSCULAR | Status: DC | PRN
  Administered 2014-03-08: 0.5 mg via INTRAVENOUS
  Filled 2014-03-07: qty 1

## 2014-03-07 MED ORDER — DEXTROSE 5 % IV SOLN
Freq: Once | INTRAVENOUS | Status: AC
  Administered 2014-03-07: 03:00:00 via INTRAVENOUS

## 2014-03-07 MED ORDER — ACETAMINOPHEN 325 MG PO TABS
650.0000 mg | ORAL_TABLET | Freq: Four times a day (QID) | ORAL | Status: DC | PRN
  Administered 2014-03-07: 650 mg via ORAL
  Filled 2014-03-07: qty 2

## 2014-03-07 MED ORDER — BIOTENE DRY MOUTH MT LIQD
15.0000 mL | Freq: Two times a day (BID) | OROMUCOSAL | Status: DC
  Administered 2014-03-07 – 2014-03-11 (×7): 15 mL via OROMUCOSAL

## 2014-03-07 MED ORDER — DEXAMETHASONE 2 MG PO TABS
2.0000 mg | ORAL_TABLET | Freq: Two times a day (BID) | ORAL | Status: DC
  Administered 2014-03-07 – 2014-03-11 (×8): 2 mg via ORAL
  Filled 2014-03-07 (×13): qty 1

## 2014-03-07 MED ORDER — BIOTENE DRY MOUTH MT LIQD
15.0000 mL | Freq: Two times a day (BID) | OROMUCOSAL | Status: DC
  Administered 2014-03-07 – 2014-03-11 (×9): 15 mL via OROMUCOSAL

## 2014-03-07 MED ORDER — FENTANYL 25 MCG/HR TD PT72
25.0000 ug | MEDICATED_PATCH | TRANSDERMAL | Status: DC
  Administered 2014-03-08 – 2014-03-11 (×2): 25 ug via TRANSDERMAL
  Filled 2014-03-07 (×2): qty 1

## 2014-03-07 MED ORDER — SODIUM CHLORIDE 0.9 % IV BOLUS (SEPSIS)
1000.0000 mL | Freq: Once | INTRAVENOUS | Status: AC
  Administered 2014-03-07: 1000 mL via INTRAVENOUS

## 2014-03-07 MED ORDER — OXYCODONE HCL 5 MG PO TABS
7.5000 mg | ORAL_TABLET | ORAL | Status: DC | PRN
  Administered 2014-03-07 – 2014-03-10 (×3): 7.5 mg via ORAL
  Filled 2014-03-07 (×3): qty 2

## 2014-03-07 MED ORDER — ACETAMINOPHEN 650 MG RE SUPP: 650.0000 mg | Freq: Four times a day (QID) | RECTAL | Status: DC | PRN

## 2014-03-07 MED ORDER — HYDROCORTISONE NA SUCCINATE PF 100 MG IJ SOLR
50.0000 mg | Freq: Once | INTRAMUSCULAR | Status: AC
  Administered 2014-03-07: 50 mg via INTRAVENOUS
  Filled 2014-03-07: qty 1

## 2014-03-07 MED ORDER — ONDANSETRON HCL 4 MG/2ML IJ SOLN: 4.0000 mg | Freq: Four times a day (QID) | INTRAMUSCULAR | Status: DC | PRN

## 2014-03-07 MED ORDER — SODIUM CHLORIDE 0.9 % IV SOLN: INTRAVENOUS | Status: DC

## 2014-03-07 MED ORDER — ONDANSETRON HCL 4 MG PO TABS: 4.0000 mg | ORAL_TABLET | Freq: Four times a day (QID) | ORAL | Status: DC | PRN

## 2014-03-07 MED ORDER — NITROGLYCERIN 0.4 MG SL SUBL: 0.4000 mg | SUBLINGUAL_TABLET | SUBLINGUAL | Status: DC | PRN

## 2014-03-07 MED ORDER — DEXTROSE 5 % IV SOLN
INTRAVENOUS | Status: AC
  Administered 2014-03-07 – 2014-03-08 (×2): via INTRAVENOUS

## 2014-03-07 MED ORDER — SODIUM CHLORIDE 0.9 % IJ SOLN
10.0000 mL | INTRAMUSCULAR | Status: DC | PRN
  Administered 2014-03-07 – 2014-03-08 (×3): 10 mL

## 2014-03-07 MED ORDER — DEXTROSE 5 % IV SOLN
1.0000 g | INTRAVENOUS | Status: DC
  Administered 2014-03-08 – 2014-03-11 (×4): 1 g via INTRAVENOUS
  Filled 2014-03-07 (×4): qty 10

## 2014-03-07 MED ORDER — DEXTROSE 5 % IV SOLN
1.0000 g | Freq: Once | INTRAVENOUS | Status: AC
  Administered 2014-03-07: 1 g via INTRAVENOUS
  Filled 2014-03-07: qty 10

## 2014-03-07 MED ORDER — ENSURE COMPLETE PO LIQD
237.0000 mL | Freq: Two times a day (BID) | ORAL | Status: DC
  Administered 2014-03-08 – 2014-03-11 (×3): 237 mL via ORAL

## 2014-03-07 NOTE — Progress Notes (Signed)
INITIAL NUTRITION ASSESSMENT  Pt meets criteria for severe MALNUTRITION in the context of chronic illness as evidenced by severe muscle wasting and subcutaneous fat loss throughout upper body with 27% weight loss in the past year per son's report.  DOCUMENTATION CODES Per approved criteria  -Severe malnutrition in the context of chronic illness -Underweight   INTERVENTION: - Assisted son with ordering meals for pt - Ensure Complete BID - Nutrition per palliative care meeting - RD to continue to monitor   NUTRITION DIAGNOSIS: Inadequate oral intake related to likely close to end of life as evidenced by MD notes.   Goal: Nutrition per goals of palliative care meeting and pt/family wishes  Monitor:  Weights, labs, intake, goals of care   Reason for Assessment: Malnutrition screening tool   80 y.o. female  Admitting Dx: Failure to thrive  ASSESSMENT: Pt with history of metastatic colon cancer, CAD, hypertension was brought to the ER patient was found to be increasingly lethargic and weak.   Son at bedside who reports pt has only consumed only 1 bite of food per day for the past week however ate 4 bites of mashed potatoes today. Son reports pt has been getting full liquid diet including grits, mashed potatoes, and cream of mushroom soup as well as chocolate Ensure. States pt has lost 40 pounds unintentionally over the past year. Pt visibly cachetic in upper body. Awaiting palliative care meeting.   BUN/Cr elevated with low GFR Alk phos, AST/ALT, and total bilirubin significantly elevated   Height: Ht Readings from Last 1 Encounters:  03/07/14 $RemoveB'5\' 6"'IZRtOgSo$  (1.676 m)    Weight: Wt Readings from Last 1 Encounters:  03/07/14 110 lb (49.896 kg)    Ideal Body Weight: 130 lbs  % Ideal Body Weight: 85%  Wt Readings from Last 10 Encounters:  03/07/14 110 lb (49.896 kg)  07/04/12 132 lb 1.6 oz (59.92 kg)  07/03/12 133 lb (60.328 kg)  11/26/11 152 lb (68.947 kg)  10/30/11 153 lb  (69.4 kg)  04/22/11 159 lb 12.8 oz (72.485 kg)  10/16/10 155 lb 8 oz (70.534 kg)  04/12/10 150 lb (68.04 kg)  11/15/09 154 lb (69.854 kg)  05/29/09 154 lb (69.854 kg)    Usual Body Weight: 150 lbs   % Usual Body Weight: 73%  BMI:  Body mass index is 17.76 kg/(m^2). Underweight   Estimated Nutritional Needs: Kcal: 1500-1700 Protein: 60-75g Fluid: 1.5-1.7L/day  Skin: +2 RLE, LLE edema   Diet Order: Dysphagia 1, thin   EDUCATION NEEDS: -No education needs identified at this time   Intake/Output Summary (Last 24 hours) at 03/07/14 1552 Last data filed at 03/07/14 1500  Gross per 24 hour  Intake   1140 ml  Output    245 ml  Net    895 ml    Last BM: PTA   Labs:   Recent Labs Lab 03/06/14 2330 03/07/14 0839  NA 144 143  K 4.5 4.1  CL 106 105  CO2 23 22  BUN 73* 71*  CREATININE 2.10* 2.11*  CALCIUM 10.8* 10.1  GLUCOSE 57* 109*    CBG (last 3)   Recent Labs  03/07/14 0720 03/07/14 1134  GLUCAP 81 115*    Scheduled Meds: . antiseptic oral rinse  15 mL Mouth Rinse q12n4p  . antiseptic oral rinse  15 mL Mouth Rinse BID  . [START ON 03/08/2014] cefTRIAXone (ROCEPHIN)  IV  1 g Intravenous Q24H  . dexamethasone  2 mg Oral BID WC  . enoxaparin (LOVENOX)  injection  30 mg Subcutaneous Q24H  . [START ON 03/08/2014] fentaNYL  25 mcg Transdermal Q72H  . latanoprost  1 drop Both Eyes QHS    Continuous Infusions: . dextrose 75 mL/hr at 03/07/14 1509    Past Medical History  Diagnosis Date  . CAD (coronary artery disease) 12/09    CAD status post successful percutanous coronary intervention and steneting of the first obtuse marginal with placement of a 2.75 x 23 mm XIENCE V drug-eluting   . HTN (hypertension)   . Hyperlipidemia   . GERD (gastroesophageal reflux disease)   . Chronic back pain   . Kidney stone     Past Surgical History  Procedure Laterality Date  . Liver biopsy  07/04/2012  . Cardiac catheterization  2006    stent    Mikey College  MS, [place], [place] Pager [phone] After Hours Pager

## 2014-03-07 NOTE — Progress Notes (Signed)
ANTIBIOTIC CONSULT NOTE - INITIAL  Pharmacy Consult for Rocephin Indication: UTI  No Known Allergies  Patient Measurements: Height: 5\' 6"  (167.6 cm) Weight: 110 lb (49.896 kg) IBW/kg (Calculated) : 59.3    Vital Signs: Temp: 95.5 F (35.3 C) (04/27 0452) Temp src: Rectal (04/27 0452) BP: 129/57 mmHg (04/27 0446) Pulse Rate: 85 (04/27 0446) Intake/Output from previous day: 04/26 0701 - 04/27 0700 In: 50 [IV Piggyback:50] Out: 95 [Urine:95] Intake/Output from this shift: Total I/O In: 50 [IV Piggyback:50] Out: 95 [Urine:95]  Labs:  Recent Labs  03/06/14 2330  WBC 12.1*  HGB 10.9*  PLT 286  CREATININE 2.10*   Estimated Creatinine Clearance: 15.7 ml/min (by C-G formula based on Cr of 2.1). No results found for this basename: VANCOTROUGH, VANCOPEAK, VANCORANDOM, GENTTROUGH, GENTPEAK, GENTRANDOM, TOBRATROUGH, TOBRAPEAK, TOBRARND, AMIKACINPEAK, AMIKACINTROU, AMIKACIN,  in the last 72 hours   Microbiology: No results found for this or any previous visit (from the past 720 hour(s)).  Medical History: Past Medical History  Diagnosis Date  . CAD (coronary artery disease) 12/09    CAD status post successful percutanous coronary intervention and steneting of the first obtuse marginal with placement of a 2.75 x 23 mm XIENCE V drug-eluting   . HTN (hypertension)   . Hyperlipidemia   . GERD (gastroesophageal reflux disease)   . Chronic back pain   . Kidney stone     Medications:  Scheduled:  . [START ON 03/08/2014] cefTRIAXone (ROCEPHIN)  IV  1 g Intravenous Q24H  . dexamethasone  2 mg Oral BID WC  . enoxaparin (LOVENOX) injection  30 mg Subcutaneous Q24H  . [START ON 03/08/2014] fentaNYL  25 mcg Transdermal Q72H  . hydrocortisone sod succinate (SOLU-CORTEF) inj  50 mg Intravenous Once  . latanoprost  1 drop Both Eyes QHS   Infusions:  . dextrose     Assessment:  80 yr female with stage 4 colon cancer presents with altered mental status and failure to thrive  U/A  suggests UTI  Rocephin per pharmacy dosing ordered for UTI  Patient has received Rocephin 1gm IV x 1 in ED @ 03:45  Goal of Therapy:  Eradication of infection  Plan:  Rocephin 1gm IV q24h Will sign off as no further adjustment of Rocephin warranted (do not need to be renally adjusted)  Thank you, Everette Rank, PharmD 03/07/2014,5:18 AM

## 2014-03-07 NOTE — Progress Notes (Signed)
[facility]  Patient Status: Active (receiving services up to time of hospitalization)  [facility] is providing the following services: RN  If patient discharges after hours, please call [phone].   Kiara Watkins 03/07/2014, 11:06 AM

## 2014-03-07 NOTE — Consult Note (Signed)
Patient Kiara Watkins      DOB: [birth year]      XFG:[id number]     Consult Note from the Palliative Medicine Team at [place] Requested by: Dr Broadus John     PCP: Salena Saner., MD Reason for Consultation:Clarification of [place] and options     Phone Number:[phone]  Assessment of patients Current state:  Continued failure to thrive.  Family faced with advanced directive questions and anticipatory care needs.   Consult is for review of medical treatment options, clarification of goals of care and end of life issues, disposition and options, and symptom recommendation.  This NP Wadie Lessen reviewed medical records, received report from team, assessed the patient and then meet at the patient's bedside along with her son Isa Hitz and his wife Colletta [place] and daughter Marcelina Morel  to discuss diagnosis prognosis, [place], EOL wishes disposition and options.  A detailed discussion was had today regarding advanced directives.  Concepts specific to code status, artifical feeding and hydration, continued IV antibiotics and rehospitalization was had.  The difference between a aggressive medical intervention path  and a palliative comfort care path for this patient at this time was had.  Values and goals of care important to patient and family were attempted to be elicited.  Concept of Hospice and Palliative Care were discussed  Natural trajectory and expectations at EOL were discussed.  Questions and concerns addressed.  Hard Choices booklet left for review. Family encouraged to call with questions or concerns.  PMT will continue to support holistically.   Goals of Care: 1.  Code Status:Full   2. Scope of Treatment: At this time family is open to all available and offered medical interventions to prolong life.  They are open to meeting with this NP,  after discussion with the rest of the family.  Await a call back to schedule a regoal.  3. Disposition:  Home is priority.   They currently get 3-5 hr/day of in home services through [facility].  They would lose these hours if they accepted hospice in the home.     4. Symptom Management:   1. Weakness/ Failure to thrive: medical management of treatable conditions  5. Psychosocial:  Emotional support offered to family.  They struggle with knowing that they "have done everything right" for this patient whom they love very much.  6. Spiritual:  Strong community church support     Patient Documents Completed or Given: Document Given Completed  Advanced Directives Pkt    MOST yes   DNR    Gone from My Sight    Hard Choices yes     Brief HPI:  Kiara Watkins is a 80 y.o. female with history of metastatic colon cancer, CAD, hypertension was brought to the ER patient was found to be increasingly lethargic and weak. As per patient's family who was at the bedside, patient was recently admitted at [facility] for encephalopathy and was found to be dehydrated.  Patient has received most of her treatment for cancer at [facility].  Labs reveal mildly elevated creatinine. UA shows features consistent with UTI. On arrival patient was hypotensive but improved with little normal saline bolus. On exam patient is following commands but appears weak and lethargic. Moves all extremities.  Overall failure to thrive secondary to metastatic disease; continued physical, functional and cognitive decline.  ROS: unable to illicit due to decreased mental status   PMH:  Past Medical History  Diagnosis Date  . CAD (coronary artery disease) 12/09    CAD status post successful percutanous coronary intervention and steneting of the first obtuse marginal with placement of a 2.75 x 23 mm XIENCE V drug-eluting   . HTN (hypertension)   . Hyperlipidemia   . GERD (gastroesophageal reflux disease)   . Chronic back pain   . Kidney stone      PSH: Past Surgical History  Procedure Laterality Date   . Liver biopsy  07/04/2012  . Cardiac catheterization  2006    stent   I have reviewed the [place] and SH and  If appropriate update it with new information. No Known Allergies Scheduled Meds: . antiseptic oral rinse  15 mL Mouth Rinse q12n4p  . antiseptic oral rinse  15 mL Mouth Rinse BID  . [START ON 03/08/2014] cefTRIAXone (ROCEPHIN)  IV  1 g Intravenous Q24H  . dexamethasone  2 mg Oral BID WC  . enoxaparin (LOVENOX) injection  30 mg Subcutaneous Q24H  . [START ON 03/08/2014] fentaNYL  25 mcg Transdermal Q72H  . latanoprost  1 drop Both Eyes QHS   Continuous Infusions: . dextrose     PRN Meds:.acetaminophen, acetaminophen, morphine injection, nitroGLYCERIN, ondansetron (ZOFRAN) IV, ondansetron, oxyCODONE, sodium chloride    BP 129/57  Pulse 85  Temp(Src) 95.5 F (35.3 C) (Rectal)  Resp 12  Ht 5\' 6"  (1.676 m)  Wt 49.896 kg (110 lb)  BMI 17.76 kg/m2  SpO2 100%   PPS:30 % at best   Intake/Output Summary (Last 24 hours) at 03/07/14 1506 Last data filed at 03/07/14 0810  Gross per 24 hour  Intake    540 ml  Output     95 ml  Net    445 ml    Physical Exam:  General: cachetic, frail, lethargic female HEENT:  Moist membranes, no exudate Chest:   Decreased in bases, CTA CVS: RRR Abdomen:mildly distended and tender to exam Ext: BLE +2 edema Neuro:lethargic  Labs: CBC    Component Value Date/Time   WBC 14.2* 03/07/2014 0839   RBC 4.05 03/07/2014 0839   RBC 4.14 07/04/2012 0615   HGB 10.8* 03/07/2014 0839   HCT 34.0* 03/07/2014 0839   PLT 249 03/07/2014 0839   MCV 84.0 03/07/2014 0839   MCH 26.7 03/07/2014 0839   MCHC 31.8 03/07/2014 0839   RDW 21.1* 03/07/2014 0839   LYMPHSABS 0.3* 03/07/2014 0839   MONOABS 0.4 03/07/2014 0839   EOSABS 0.0 03/07/2014 0839   BASOSABS 0.0 03/07/2014 0839    BMET    Component Value Date/Time   NA 143 03/07/2014 0839   K 4.1 03/07/2014 0839   CL 105 03/07/2014 0839   CO2 22 03/07/2014 0839   GLUCOSE 109* 03/07/2014 0839   BUN 71*  03/07/2014 0839   CREATININE 2.11* 03/07/2014 0839   CALCIUM 10.1 03/07/2014 0839   GFRNONAA 20* 03/07/2014 0839   GFRAA 24* 03/07/2014 0839    CMP     Component Value Date/Time   NA 143 03/07/2014 0839   K 4.1 03/07/2014 0839   CL 105 03/07/2014 0839   CO2 22 03/07/2014 0839   GLUCOSE 109* 03/07/2014 0839   BUN 71* 03/07/2014 0839   CREATININE 2.11* 03/07/2014 0839   CALCIUM 10.1 03/07/2014 0839   PROT 6.0 03/07/2014 0839   ALBUMIN 1.6* 03/07/2014 0839   AST 525* 03/07/2014 0839   ALT 142* 03/07/2014 0839   ALKPHOS 657* 03/07/2014 0839   BILITOT 9.2* 03/07/2014 0839   GFRNONAA 20*  03/07/2014 0839   GFRAA 24* 03/07/2014 0839     Time In Time Out Total Time Spent with Patient Total Overall Time  1315 1445 80 min 90 min    Greater than 50%  of this time was spent counseling and coordinating care related to the above assessment and plan.   Wadie Lessen NP  Palliative Medicine Team Team Phone # [phone] Pager [phone]  Discussed with Dr Broadus John

## 2014-03-07 NOTE — Progress Notes (Addendum)
Pt seen and examined, admitted earlier this am by Dr.Kakrakandy,  86/F Stage 4 colon CA with liver mets, worsening liver failure with adult failure to thrive, close to end of life, also has UTI, dehydration, AKI, metabolic encephalopathy, severe protein calorie malnutrition. Her Oncologist Dr.Sanders at [facility], he told family she has approx 2 months to live last week and recommended Hospice. Family tried hospice care for a little while and then decided against it due to the fact that she couldn't get IVF under Hospice, [facility] I called and discussed with main family contact/Son Pryor Ochoa who lives with her and recommended Palliative meeting for Goals of care He is agreeable to this Continue current treatment as started by Dr.Kakrakandy this am  Domenic Polite, MD [phone]

## 2014-03-07 NOTE — ED Notes (Signed)
Family at bedside states patient had a slow pulse at home, rate unknown. States patient has had significantly declining PO intake over the past 4 days, taking only one bite of food per day. Patient family states they were offered Hospice services but have declined, states patient is receiving home health services on alternating days receiving IV fluids.

## 2014-03-07 NOTE — ED Notes (Signed)
Dr. Kakrakandy at bedside. 

## 2014-03-07 NOTE — ED Notes (Signed)
Dr Hal Hope spoke to family in reference to transporting patient to [facility]. Spoke to floor nurse to advise there will be a hold at this time for transporting this patient upstairs until transport to [facility] is confirmed or denied.

## 2014-03-07 NOTE — Progress Notes (Signed)
Received report from [facility] in ER dept on pt to be admitted to room#1445

## 2014-03-07 NOTE — ED Provider Notes (Addendum)
CSN: [id number]     Arrival date & time 03/06/14  2317 History   First MD Initiated Contact with Patient 03/06/14 2355     Chief Complaint  Patient presents with  . Failure To Thrive     (Consider location/radiation/quality/duration/timing/severity/associated sxs/prior Treatment) HPI Level 5 Caveat: altered mental status. This is an 80 year old female with end-stage metastatic colon cancer. She has had a general decline over the past 4 days. She has not been eating. She has been minimally responsive. This evening her family noted her breathing to be shallow and thought her pulse rate felt well. She had a DO NOT RESUSCITATE but the family refused to give it to EMS and told EMS that they "want everything done". She has an indwelling Foley and the family states that it hurts when she urinates. She also has pain in her lower legs when touched. She has been getting IV fluid boluses every other day by a home health nurse.  Past Medical History  Diagnosis Date  . CAD (coronary artery disease) 12/09    CAD status post successful percutanous coronary intervention and steneting of the first obtuse marginal with placement of a 2.75 x 23 mm XIENCE V drug-eluting   . HTN (hypertension)   . Hyperlipidemia   . GERD (gastroesophageal reflux disease)   . Chronic back pain   . Kidney stone    Past Surgical History  Procedure Laterality Date  . Liver biopsy  07/04/2012  . Cardiac catheterization  2006    stent   Family History  Problem Relation Age of Onset  . Coronary artery disease Neg Hx    History  Substance Use Topics  . Smoking status: Former Research scientist (life sciences)  . Smokeless tobacco: Not on file     Comment: quit approximately 30 years ao   . Alcohol Use: No     Comment: quit approximately 30 years ago    OB History   Grav Para Term Preterm Abortions TAB SAB Ect Mult Living                 Review of Systems  Unable to perform ROS  Allergies  Review of patient's allergies indicates no known  allergies.  Home Medications   Prior to Admission medications   Medication Sig Start Date End Date Taking? Authorizing Provider  amLODipine-benazepril (LOTREL) 10-20 MG per capsule Take 1 capsule by mouth daily.      Historical Provider, MD  aspirin 81 MG tablet Take 81 mg by mouth daily.      Historical Provider, MD  calcium-vitamin D (OSCAL WITH D) 500-200 MG-UNIT per tablet Take 1 tablet by mouth daily.      Historical Provider, MD  ibuprofen (ADVIL,MOTRIN) 200 MG tablet Take 200 mg by mouth every 6 (six) hours as needed. For headache    Historical Provider, MD  latanoprost (XALATAN) 0.005 % ophthalmic solution Place 1 drop into both eyes at bedtime.     Historical Provider, MD  nitroGLYCERIN (NITROSTAT) 0.4 MG SL tablet Place 0.4 mg under the tongue every 5 (five) minutes as needed. For chest pain    Historical Provider, MD  omeprazole (PRILOSEC) 20 MG capsule Take 20 mg by mouth daily.      Historical Provider, MD  Vitamin D, Ergocalciferol, (DRISDOL) 50000 UNITS CAPS Take 50,000 Units by mouth 2 (two) times a week.    Historical Provider, MD   BP 129/57  Pulse 85  Temp(Src) 95.5 F (35.3 C) (Rectal)  Resp 12  Ht 5'  6" (1.676 m)  Wt 110 lb (49.896 kg)  BMI 17.76 kg/m2  SpO2 100%  Physical Exam General: Well-developed,  cachectic female in no acute distress; appearance consistent with age of record HENT: normocephalic; atraumatic; edentulous Eyes: pupils equal, round and reactive to light; scleral icterus; arcus senilis bilaterally Neck: supple Heart: regular rate and rhythm Lungs: clear to auscultation bilaterally; shallow respirations Chest: Port-A-Cath left upper chest Abdomen: soft; nondistended; nontender; hepatomegaly; bowel sounds present GU: Indwelling Foley catheter draining coffee-colored urine Extremities: No deformity; full range of motion; +1 edema of lower legs; tenderness of lower legs Neurologic: Opens eyes, nonverbal; will not follow commands; will move to  painful stimuli Skin: Warm and dry   ED Course  Procedures (including critical care time)  MDM   Nursing notes and vitals signs, including pulse oximetry, reviewed.  Summary of this visit's results, reviewed by myself:  Labs:  Results for orders placed during the hospital encounter of 03/06/14 (from the past 24 hour(s))  COMPREHENSIVE METABOLIC PANEL     Status: Abnormal   Collection Time    03/06/14 11:30 PM      Result Value Ref Range   Sodium 144  137 - 147 mEq/L   Potassium 4.5  3.7 - 5.3 mEq/L   Chloride 106  96 - 112 mEq/L   CO2 23  19 - 32 mEq/L   Glucose, Bld 57 (*) 70 - 99 mg/dL   BUN 73 (*) 6 - 23 mg/dL   Creatinine, Ser 2.10 (*) 0.50 - 1.10 mg/dL   Calcium 10.8 (*) 8.4 - 10.5 mg/dL   Total Protein 6.2  6.0 - 8.3 g/dL   Albumin 1.6 (*) 3.5 - 5.2 g/dL   AST 412 (*) 0 - 37 U/L   ALT 125 (*) 0 - 35 U/L   Alkaline Phosphatase 647 (*) 39 - 117 U/L   Total Bilirubin 9.1 (*) 0.3 - 1.2 mg/dL   GFR calc non Af Amer 21 (*) >90 mL/min   GFR calc Af Amer 24 (*) >90 mL/min  CBC WITH DIFFERENTIAL     Status: Abnormal   Collection Time    03/06/14 11:30 PM      Result Value Ref Range   WBC 12.1 (*) 4.0 - 10.5 K/uL   RBC 4.11  3.87 - 5.11 MIL/uL   Hemoglobin 10.9 (*) 12.0 - 15.0 g/dL   HCT 33.1 (*) 36.0 - 46.0 %   MCV 80.5  78.0 - 100.0 fL   MCH 26.5  26.0 - 34.0 pg   MCHC 32.9  30.0 - 36.0 g/dL   RDW 21.6 (*) 11.5 - 15.5 %   Platelets 286  150 - 400 K/uL   Neutrophils Relative % 92 (*) 43 - 77 %   Neutro Abs 11.1 (*) 1.7 - 7.7 K/uL   Lymphocytes Relative 3 (*) 12 - 46 %   Lymphs Abs 0.4 (*) 0.7 - 4.0 K/uL   Monocytes Relative 4  3 - 12 %   Monocytes Absolute 0.5  0.1 - 1.0 K/uL   Eosinophils Relative 0  0 - 5 %   Eosinophils Absolute 0.0  0.0 - 0.7 K/uL   Basophils Relative 0  0 - 1 %   Basophils Absolute 0.0  0.0 - 0.1 K/uL  BLOOD GAS, VENOUS     Status: Abnormal   Collection Time    03/07/14 12:25 AM      Result Value Ref Range   pH, Ven 7.377 (*) 7.250 -  7.300  pCO2, Ven 37.3 (*) 45.0 - 50.0 mmHg   pO2, Ven 42.9  30.0 - 45.0 mmHg   Bicarbonate 21.4  20.0 - 24.0 mEq/L   TCO2 19.7  0 - 100 mmol/L   Acid-base deficit 2.8 (*) 0.0 - 2.0 mmol/L   O2 Saturation 76.6     Patient temperature 98.6     Collection site VEIN     Drawn by [place]     Sample type VEIN    URINALYSIS, ROUTINE W REFLEX MICROSCOPIC     Status: Abnormal   Collection Time    03/07/14  2:38 AM      Result Value Ref Range   Color, Urine ORANGE (*) YELLOW   APPearance TURBID (*) CLEAR   Specific Gravity, Urine 1.013  1.005 - 1.030   pH 5.0  5.0 - 8.0   Glucose, UA NEGATIVE  NEGATIVE mg/dL   Hgb urine dipstick LARGE (*) NEGATIVE   Bilirubin Urine LARGE (*) NEGATIVE   Ketones, ur NEGATIVE  NEGATIVE mg/dL   Protein, ur 30 (*) NEGATIVE mg/dL   Urobilinogen, UA 1.0  0.0 - 1.0 mg/dL   Nitrite POSITIVE (*) NEGATIVE   Leukocytes, UA LARGE (*) NEGATIVE  URINE MICROSCOPIC-ADD ON     Status: Abnormal   Collection Time    03/07/14  2:38 AM      Result Value Ref Range   Squamous Epithelial / LPF RARE  RARE   WBC, UA TOO NUMEROUS TO COUNT  <3 WBC/hpf   RBC / HPF TOO NUMEROUS TO COUNT  <3 RBC/hpf   Bacteria, UA MANY (*) RARE   3:22 AM Hospitalist to admit. Aggressive management at this stage appears futile. This will need to be discussed with the family as well as the possibility of placing the patient on hospice.    Wynetta Fines, MD 03/07/14 2751  Wynetta Fines, MD 03/07/14 [phone]

## 2014-03-07 NOTE — Plan of Care (Signed)
Problem: Phase I Progression Outcomes Goal: Voiding-avoid urinary catheter unless indicated Outcome: Not Applicable Date Met:  24/58/09 Has Foley for end of life care

## 2014-03-07 NOTE — H&P (Addendum)
[facility] History and Physical  Kiara Watkins UUV:[id number] DOB: 1927/06/15 DOA: 03/06/2014  Referring physician: ER physician PCP: Salena Saner., MD   Chief Complaint: Weakness.  History obtained from patient's son and daughter as patient is lethargic.  HPI: Kiara Watkins is a 80 y.o. female with history of metastatic colon cancer, CAD, hypertension was brought to the ER patient was found to be increasingly lethargic and weak. As per patient's family who was at the bedside, patient was [facility] for encephalopathy and was found to be dehydrated. Patient was is discharged 2 days ago. Patient has [facility] most of [facility]. Family at this time does not want patient to be transferred [facility] and wants to be treated at [place] reveal mildly elevated creatinine. UA shows features consistent with UTI. On arrival patient was hypotensive but improved with little normal saline bolus. On exam patient is following commands but appears weak and lethargic. Moves all extremities. As per family patient's oncologist has stopped her chemotherapy last month. Family at this time is requesting everything to be done for the patient. Patient has per the family has been getting progressively weak over the last 3 weeks and has been eating very minimally. Patient did not have any chest pain or shortness of breath no nausea vomiting abdominal pain or diarrhea.  Review of Systems: As presented in the history of presenting illness, rest negative.  Past Medical History  Diagnosis Date  . CAD (coronary artery disease) 12/09    CAD status post successful percutanous coronary intervention and steneting of the first obtuse marginal with placement of a 2.75 x 23 mm XIENCE V drug-eluting   . HTN (hypertension)   . Hyperlipidemia   . GERD (gastroesophageal reflux  disease)   . Chronic back pain   . Kidney stone    Past Surgical History  Procedure Laterality Date  . Liver biopsy  07/04/2012  . Cardiac catheterization  2006    stent   Social History:  reports that she has quit smoking. She does not have any smokeless tobacco history on file. She reports that she does not drink alcohol or use illicit drugs. Where does patient live home. Can patient participate in ADLs? No.  No Known Allergies  Family History:  Family History  Problem Relation Age of Onset  . Coronary artery disease Neg Hx       Prior to Admission medications   Medication Sig Start Date End Date Taking? Authorizing Provider  amLODipine-benazepril (LOTREL) 10-20 MG per capsule Take 1 capsule by mouth daily.     Yes Historical Provider, MD  dexamethasone (DECADRON) 2 MG tablet Take 2 mg by mouth 2 (two) times daily with a meal.   Yes Historical Provider, MD  fentaNYL (DURAGESIC - DOSED MCG/HR) 25 MCG/HR patch Place 25 mcg onto the skin every 3 (three) days. Due for change on 28th   Yes Historical Provider, MD  furosemide (LASIX) 20 MG tablet Take 20 mg by mouth daily.   Yes Historical Provider, MD  latanoprost (XALATAN) 0.005 % ophthalmic solution Place 1 drop into both eyes at bedtime.    Yes Historical Provider, MD  nitroGLYCERIN (NITROSTAT) 0.4 MG SL tablet Place 0.4 mg under the tongue every 5 (five) minutes as needed. For chest pain   Yes Historical Provider, MD  oxyCODONE (ROXICODONE) 15 MG immediate release tablet Take 7.5 mg by  mouth every 4 (four) hours as needed for pain (takes 1/2 tablet for pain prn).   Yes Historical Provider, MD    Physical Exam: Filed Vitals:   03/07/14 0200 03/07/14 0230 03/07/14 0300 03/07/14 0330  BP: 97/61 95/58 91/52  97/56  Pulse:      Temp:      TempSrc:      Resp: 16 17 13 19   Height:      Weight:      SpO2:         General:  Well-developed and poorly nourished.  Eyes: Anicteric no pallor.  ENT: No discharge from the ears eyes  nose mouth.  Neck: No mass felt.  Cardiovascular: S1-S2 heard.  Respiratory: No rhonchi or crepitations.  Abdomen: Soft nontender bowel sounds present.  Skin: No rash.  Musculoskeletal: Mild edema in the lower extremity.  Psychiatric: Patient is lethargic but follows commands.  Neurologic: Moves all extremities.  Labs on Admission:  Basic Metabolic Panel:  Recent Labs Lab 03/06/14 2330  NA 144  K 4.5  CL 106  CO2 23  GLUCOSE 57*  BUN 73*  CREATININE 2.10*  CALCIUM 10.8*   Liver Function Tests:  Recent Labs Lab 03/06/14 2330  AST 412*  ALT 125*  ALKPHOS 647*  BILITOT 9.1*  PROT 6.2  ALBUMIN 1.6*   No results found for this basename: LIPASE, AMYLASE,  in the last 168 hours No results found for this basename: AMMONIA,  in the last 168 hours CBC:  Recent Labs Lab 03/06/14 2330  WBC 12.1*  NEUTROABS 11.1*  HGB 10.9*  HCT 33.1*  MCV 80.5  PLT 286   Cardiac Enzymes: No results found for this basename: CKTOTAL, CKMB, CKMBINDEX, TROPONINI,  in the last 168 hours  BNP (last 3 results) No results found for this basename: PROBNP,  in the last 8760 hours CBG: No results found for this basename: GLUCAP,  in the last 168 hours  Radiological Exams on Admission: No results found.   Assessment/Plan Principal Problem:   Failure to thrive Active Problems:   Essential hypertension, benign   CAD, NATIVE VESSEL   Renal failure   Anemia   1. Failure to thrive with history of metastatic colon cancer - at this time patient has been placed on D5W for hydration and we will continue with home medications including fentanyl. 2. Possible UTI - follow urine cultures. Continue ceftriaxone. 3. Elevated LFTs - probably from metastatic cancer. Follow LFTs. I have requested patient's chart from [facility] to get baseline labs. 4. Mild hypercalcemia - probably from dehydration and cancer. Continue hydration and follow metabolic panel. 5. Anemia baseline  hemoglobin not known - follow CBC. 6. History of hypertension - presently on presentation patient was hypotensive. So holding off antihypertensives for now. 7. History of CAD.  I have had extensive discussion with patient's son and daughter at the bedside. They have requested to continue with lab work and all required treatment. Family do not want hospice consult. Patients chart from [facility] has been requested.  Code Status: Full code.  Family Communication: Patient's daughter and son at the bedside.  Disposition Plan: Admit to inpatient.    [facility] Pager [phone].  If 7PM-7AM, please contact night-coverage [email or web address] Password [email or web address] 03/07/2014, 4:02 AM

## 2014-03-08 DIAGNOSIS — Z515 Encounter for palliative care: Secondary | ICD-10-CM

## 2014-03-08 DIAGNOSIS — D649 Anemia, unspecified: Secondary | ICD-10-CM

## 2014-03-08 DIAGNOSIS — I2510 Atherosclerotic heart disease of native coronary artery without angina pectoris: Secondary | ICD-10-CM

## 2014-03-08 DIAGNOSIS — I10 Essential (primary) hypertension: Secondary | ICD-10-CM

## 2014-03-08 DIAGNOSIS — C787 Secondary malignant neoplasm of liver and intrahepatic bile duct: Secondary | ICD-10-CM

## 2014-03-08 DIAGNOSIS — C189 Malignant neoplasm of colon, unspecified: Principal | ICD-10-CM

## 2014-03-08 DIAGNOSIS — E43 Unspecified severe protein-calorie malnutrition: Secondary | ICD-10-CM | POA: Insufficient documentation

## 2014-03-08 LAB — COMPREHENSIVE METABOLIC PANEL
ALK PHOS: 676 U/L — AB (ref 39–117)
ALT: 135 U/L — ABNORMAL HIGH (ref 0–35)
AST: 420 U/L — ABNORMAL HIGH (ref 0–37)
Albumin: 1.5 g/dL — ABNORMAL LOW (ref 3.5–5.2)
BILIRUBIN TOTAL: 8.9 mg/dL — AB (ref 0.3–1.2)
BUN: 72 mg/dL — AB (ref 6–23)
CHLORIDE: 101 meq/L (ref 96–112)
CO2: 19 mEq/L (ref 19–32)
Calcium: 9.9 mg/dL (ref 8.4–10.5)
Creatinine, Ser: 2.11 mg/dL — ABNORMAL HIGH (ref 0.50–1.10)
GFR, EST AFRICAN AMERICAN: 24 mL/min — AB (ref >90)
GFR, EST NON AFRICAN AMERICAN: 20 mL/min — AB (ref >90)
GLUCOSE: 133 mg/dL — AB (ref 70–99)
POTASSIUM: 3.9 meq/L (ref 3.7–5.3)
Sodium: 139 mEq/L (ref 137–147)
Total Protein: 5.9 g/dL — ABNORMAL LOW (ref 6.0–8.3)

## 2014-03-08 LAB — GLUCOSE, CAPILLARY
GLUCOSE-CAPILLARY: 109 mg/dL — AB (ref 70–99)
GLUCOSE-CAPILLARY: 120 mg/dL — AB (ref 70–99)
GLUCOSE-CAPILLARY: 125 mg/dL — AB (ref 70–99)
Glucose-Capillary: 112 mg/dL — ABNORMAL HIGH (ref 70–99)
Glucose-Capillary: 95 mg/dL (ref 70–99)
Glucose-Capillary: 85 mg/dL (ref 70–99)

## 2014-03-08 LAB — CBC
HEMATOCRIT: 34.4 % — AB (ref 36.0–46.0)
Hemoglobin: 11 g/dL — ABNORMAL LOW (ref 12.0–15.0)
MCH: 26.6 pg (ref 26.0–34.0)
MCHC: 32 g/dL (ref 30.0–36.0)
MCV: 83.1 fL (ref 78.0–100.0)
Platelets: 231 10*3/uL (ref 150–400)
RBC: 4.14 MIL/uL (ref 3.87–5.11)
RDW: 21.4 % — ABNORMAL HIGH (ref 11.5–15.5)
WBC: 15.8 10*3/uL — AB (ref 4.0–10.5)

## 2014-03-08 MED ORDER — DEXTROSE 5 % IV SOLN
INTRAVENOUS | Status: AC
  Administered 2014-03-08: 11:00:00 via INTRAVENOUS
  Administered 2014-03-09: 500 mL via INTRAVENOUS

## 2014-03-08 MED ORDER — MORPHINE SULFATE 2 MG/ML IJ SOLN
1.0000 mg | INTRAMUSCULAR | Status: DC | PRN
  Administered 2014-03-09: 1 mg via INTRAVENOUS
  Administered 2014-03-09: 2 mg via INTRAVENOUS
  Administered 2014-03-09 – 2014-03-10 (×4): 1 mg via INTRAVENOUS
  Administered 2014-03-10 – 2014-03-11 (×3): 2 mg via INTRAVENOUS
  Filled 2014-03-08 (×9): qty 1

## 2014-03-08 NOTE — Progress Notes (Addendum)
[facility] PROGRESS NOTE  Kiara Watkins PNT:[id number] DOB: 1927-01-28 DOA: 03/06/2014 PCP: Salena Saner., MD Brief Narrative Kiara Watkins is a 80 y.o. female with history of metastatic colon cancer, CAD, hypertension was brought to the ER patient was found to be increasingly lethargic and weak. As per patient's family who was at the bedside, patient was [facility] for [facility]opathy and was found to be dehydrated. Patient was is discharged 2 days ago. Patient has [facility]. [facility] and [facility] to be [facility] She was recommended to have Hospice care by her Oncologist at [facility], family tried this briefly and then stopped.  Labs revealed  elevated creatinine. UA shows features consistent with UTI. On arrival patient was hypotensive but improved with little normal saline bolus.  Since admission, she is getting IVF and Abx for possible UTI, She is also being followed by Palliative care, to help family accept her terminal condition.   Assessment/Plan:  1. Stage 4 colon CA with liver mets, worsening liver failure -Her Oncologist Dr.Sanders at [facility], he told family she has approx 2 months to live last week and recommended Hospice.  Family tried hospice care for a little while and then decided against it due to the fact that she couldn't get IVF under Hospice, [facility]. I called and discussed with main family contact/Son Pryor Ochoa who lives with her and recommended Palliative meeting for Goals of care, Wadie Lessen with [facility] following -family having difficulty time coming to terms with reality -i reiterated to son again this am about how its time to focus on comfort/end of life care  2. Adult failure to thrive -due to 1  3.  UTI -continue ceftriaxone  4. Dehydration and third spacing -low dose IVF  5. metabolic encephalopathy -due to 1, liver failure, UTI  5.  severe protein calorie malnutrition.  -due to 1 -comfort feeds as tolerated  6. Renal insufficiency -possibly acute on chronic -unchanged despite IVF -also has third spacing due to hypoalbuminemia   DVT proph: lovenox  Code Status: Full Code Family Communication: d/w son [place] at bedside Disposition Plan: to be determined   Consultants:  Palliative  Antibiotics:  Ceftriaxone  HPI/Subjective: No Po intake, took 3 small bites yesterday all day  Objective: Filed Vitals:   03/08/14 0445  BP: 120/63  Pulse: 70  Temp: 97.3 F (36.3 C)  Resp: 12    Intake/Output Summary (Last 24 hours) at 03/08/14 0924 Last data filed at 03/08/14 0867  Gross per 24 hour  Intake 1238.75 ml  Output    350 ml  Net 888.75 ml   Filed Weights   03/06/14 2321 03/07/14 0446  Weight: 49.896 kg (110 lb) 49.896 kg (110 lb)    Exam:   General:  Awake, disoriented, responds to name and mumbles few words, emaciated  Cardiovascular: S1S2/RRR  Respiratory: diminished BS at bases  Abdomen: soft, Mildly distended, BS present  Musculoskeletal: 1 plus edema  Data Reviewed: Basic Metabolic Panel:  Recent Labs Lab 03/06/14 2330 03/07/14 0839 03/08/14 0520  NA 144 143 139  K 4.5 4.1 3.9  CL 106 105 101  CO2 23 22 19   GLUCOSE 57* 109* 133*  BUN 73* 71* 72*  CREATININE 2.10* 2.11* 2.11*  CALCIUM 10.8* 10.1 9.9   Liver  Function Tests:  Recent Labs Lab 03/06/14 2330 03/07/14 0839 03/08/14 0520  AST 412* 525* 420*  ALT 125* 142* 135*  ALKPHOS 647* 657* 676*  BILITOT 9.1* 9.2* 8.9*  PROT 6.2 6.0 5.9*  ALBUMIN 1.6* 1.6* 1.5*   No results found for this basename: LIPASE, AMYLASE,  in the last 168 hours  Recent Labs Lab 03/07/14 0935  AMMONIA 36   CBC:  Recent Labs Lab 03/06/14 2330 03/07/14 0839 03/08/14 0520  WBC  12.1* 14.2* 15.8*  NEUTROABS 11.1* 13.5*  --   HGB 10.9* 10.8* 11.0*  HCT 33.1* 34.0* 34.4*  MCV 80.5 84.0 83.1  PLT 286 249 231   Cardiac Enzymes: No results found for this basename: CKTOTAL, CKMB, CKMBINDEX, TROPONINI,  in the last 168 hours BNP (last 3 results) No results found for this basename: PROBNP,  in the last 8760 hours CBG:  Recent Labs Lab 03/07/14 0720 03/07/14 1134 03/07/14 1627 03/07/14 2144 03/08/14 0755  GLUCAP 81 115* 130* 138* 112*    No results found for this or any previous visit (from the past 240 hour(s)).   Studies: No results found.  Scheduled Meds: . antiseptic oral rinse  15 mL Mouth Rinse q12n4p  . antiseptic oral rinse  15 mL Mouth Rinse BID  . cefTRIAXone (ROCEPHIN)  IV  1 g Intravenous Q24H  . dexamethasone  2 mg Oral BID WC  . enoxaparin (LOVENOX) injection  30 mg Subcutaneous Q24H  . feeding supplement (ENSURE COMPLETE)  237 mL Oral BID BM  . fentaNYL  25 mcg Transdermal Q72H  . latanoprost  1 drop Both Eyes QHS   Continuous Infusions:  Antibiotics Given (last 72 hours)   Date/Time Action Medication Dose Rate   03/08/14 0341 Given   cefTRIAXone (ROCEPHIN) 1 g in dextrose 5 % 50 mL IVPB 1 g 100 mL/hr      Principal Problem:   Failure to thrive Active Problems:   Essential hypertension, benign   CAD, NATIVE VESSEL   Renal failure   Anemia   Protein-calorie malnutrition, severe    Time spent:80min    [facility] Pager [phone]. If 7PM-7AM, please contact night-coverage at [email or web address], password [email or web address] 03/08/2014, 9:24 AM  LOS: 2 days

## 2014-03-08 NOTE — Progress Notes (Signed)
  CARE MANAGEMENT NOTE 03/08/2014  Patient:  Kiara Watkins, Kiara Watkins   Account Number:  [id number]  Date Initiated:  03/08/2014  Documentation initiated by:  Gabriel Earing  Subjective/Objective Assessment:   80 y.o. female with history of metastatic colon cancer, CAD, hypertension was brought to the ER patient was found to be increasingly lethargic and weak.     Action/Plan:   from home with HH/[facility]   Anticipated DC Date:  03/10/2014   Anticipated DC Plan:  [place]  CM consult      Choice offered to / List presented to:             Status of service:  In process, will continue to follow Medicare Important Message given?  NA - LOS <3 / Initial given by admissions (If response is "NO", the following Medicare IM given date fields will be blank) Date Medicare IM given:   Date Additional Medicare IM given:    Discharge Disposition:    Per UR Regulation:  Reviewed for med. necessity/level of care/duration of stay  If discussed at Long Length of Stay Meetings, dates discussed:    Comments:  03/08/14 MMCGibboney, RN, BSN 80 y.o. female with history of metastatic colon cancer, CAD, hypertension was brought to the ER patient was found to be increasingly lethargic and weak. As per patient's family who was at the bedside, patient [facility] for [facility]opathy and was found to be dehydrated.  Patient has [facility]. Palliative Care meeting with second half of pt's family.  03/07/14 MMcGibboney, RN, BSN Corey from [facility] in to see pt. Pt is home with [facility] at present time.Will continue to follow for discharge plans. Palliative Care to see pt and meet with family.

## 2014-03-09 LAB — COMPREHENSIVE METABOLIC PANEL
ALBUMIN: 1.4 g/dL — AB (ref 3.5–5.2)
ALT: 123 U/L — ABNORMAL HIGH (ref 0–35)
AST: 346 U/L — AB (ref 0–37)
Alkaline Phosphatase: 660 U/L — ABNORMAL HIGH (ref 39–117)
BUN: 69 mg/dL — ABNORMAL HIGH (ref 6–23)
CALCIUM: 10 mg/dL (ref 8.4–10.5)
CO2: 20 meq/L (ref 19–32)
CREATININE: 1.87 mg/dL — AB (ref 0.50–1.10)
Chloride: 98 mEq/L (ref 96–112)
GFR calc Af Amer: 27 mL/min — ABNORMAL LOW (ref >90)
GFR calc non Af Amer: 24 mL/min — ABNORMAL LOW (ref >90)
Glucose, Bld: 90 mg/dL (ref 70–99)
Potassium: 4 mEq/L (ref 3.7–5.3)
Sodium: 136 mEq/L — ABNORMAL LOW (ref 137–147)
TOTAL PROTEIN: 5.6 g/dL — AB (ref 6.0–8.3)
Total Bilirubin: 8.8 mg/dL — ABNORMAL HIGH (ref 0.3–1.2)

## 2014-03-09 LAB — GLUCOSE, CAPILLARY
GLUCOSE-CAPILLARY: 89 mg/dL (ref 70–99)
GLUCOSE-CAPILLARY: 97 mg/dL (ref 70–99)
GLUCOSE-CAPILLARY: 99 mg/dL (ref 70–99)
GLUCOSE-CAPILLARY: 91 mg/dL (ref 70–99)
Glucose-Capillary: 88 mg/dL (ref 70–99)
Glucose-Capillary: 90 mg/dL (ref 70–99)

## 2014-03-09 MED ORDER — DEXTROSE 5 % IV SOLN
INTRAVENOUS | Status: AC
  Administered 2014-03-09: 100 mL via INTRAVENOUS
  Administered 2014-03-09: 500 mL via INTRAVENOUS
  Administered 2014-03-10: 03:00:00 via INTRAVENOUS

## 2014-03-09 NOTE — Progress Notes (Signed)
03/09/14 1500  Clinical Encounter Type  Visited With Patient and family together (son Dorise Bullion and his wife Anderson Malta)  Visit Type Initial;Spiritual support;Social support  Referral From Palliative care team [facility] consult)  Spiritual Encounters  Spiritual Needs Emotional  Stress Factors  Family Stress Factors (multiple family members with colon/other cancer)   Visited with son Dorise Bullion and his wife Anderson Malta to introduce [place] and chaplain availability.  Consistent with note from Wadie Lessen, NP, [facility] named that his priority is to "do everything" for his mom so that he can "take her home."  Couple appreciated opportunity to share and process story of caring for Ms Bright and navigating care (logistically and emotionally) for other family members who have colon (and other) cancer.  Provided pastoral presence, empathic listening, encouragement of self-care.  Please page as further needs arise.  Thank you.  [place], [place]

## 2014-03-09 NOTE — Progress Notes (Signed)
Progress Note from the Palliative Medicine Team at [place]: -continued conversation at bedside with son Garnet Koyanagi regarding the patient's diagnosis, prognosis, GOC and options  -we discussed the concept of futility  --family continue to request that "everything" be done to keep her alive even complete resuscitation  --plan is to take patient home when medially stable (patient is dying),  as previously documented hospice is not a good option for this family since they have 3-5 hours a days of personal care services from  the county  -educated family how to access palliative care services from hospice of the [place] once dc     Objective: No Known Allergies Scheduled Meds: . antiseptic oral rinse  15 mL Mouth Rinse q12n4p  . antiseptic oral rinse  15 mL Mouth Rinse BID  . cefTRIAXone (ROCEPHIN)  IV  1 g Intravenous Q24H  . dexamethasone  2 mg Oral BID WC  . enoxaparin (LOVENOX) injection  30 mg Subcutaneous Q24H  . feeding supplement (ENSURE COMPLETE)  237 mL Oral BID BM  . fentaNYL  25 mcg Transdermal Q72H  . latanoprost  1 drop Both Eyes QHS   Continuous Infusions:  PRN Meds:.acetaminophen, acetaminophen, morphine injection, nitroGLYCERIN, ondansetron (ZOFRAN) IV, ondansetron, oxyCODONE, sodium chloride  BP 131/71  Pulse 94  Temp(Src) 96.7 F (35.9 C) (Axillary)  Resp 16  Ht 5\' 6"  (1.676 m)  Wt 49.896 kg (110 lb)  BMI 17.76 kg/m2  SpO2 100%   PPS:20 % at best  Pain Score: unable to scale Pain Location abdomen   Intake/Output Summary (Last 24 hours) at 03/09/14 1135 Last data filed at 03/09/14 0549  Gross per 24 hour  Intake 603.33 ml  Output    575 ml  Net  28.33 ml       Physical Exam:  General: ill appearing, NAD HEENT:  + temporal muscle wasting, MM no exudate, no dentation Chest:   Decreased in bases CTA CVS: RRR Abdomen: firm, painful to gentle exam Ext: BLE +1 edema Neuro: oriented to person only  Labs: CBC    Component Value  Date/Time   WBC 15.8* 03/08/2014 0520   RBC 4.14 03/08/2014 0520   RBC 4.14 07/04/2012 0615   HGB 11.0* 03/08/2014 0520   HCT 34.4* 03/08/2014 0520   PLT 231 03/08/2014 0520   MCV 83.1 03/08/2014 0520   MCH 26.6 03/08/2014 0520   MCHC 32.0 03/08/2014 0520   RDW 21.4* 03/08/2014 0520   LYMPHSABS 0.3* 03/07/2014 0839   MONOABS 0.4 03/07/2014 0839   EOSABS 0.0 03/07/2014 0839   BASOSABS 0.0 03/07/2014 0839    BMET    Component Value Date/Time   NA 136* 03/09/2014 0540   K 4.0 03/09/2014 0540   CL 98 03/09/2014 0540   CO2 20 03/09/2014 0540   GLUCOSE 90 03/09/2014 0540   BUN 69* 03/09/2014 0540   CREATININE 1.87* 03/09/2014 0540   CALCIUM 10.0 03/09/2014 0540   GFRNONAA 24* 03/09/2014 0540   GFRAA 27* 03/09/2014 0540    CMP     Component Value Date/Time   NA 136* 03/09/2014 0540   K 4.0 03/09/2014 0540   CL 98 03/09/2014 0540   CO2 20 03/09/2014 0540   GLUCOSE 90 03/09/2014 0540   BUN 69* 03/09/2014 0540   CREATININE 1.87* 03/09/2014 0540   CALCIUM 10.0 03/09/2014 0540   PROT 5.6* 03/09/2014 0540   ALBUMIN 1.4* 03/09/2014 0540   AST 346* 03/09/2014 0540   ALT 123* 03/09/2014 0540   ALKPHOS  660* 03/09/2014 0540   BILITOT 8.8* 03/09/2014 0540   GFRNONAA 24* 03/09/2014 0540   GFRAA 27* 03/09/2014 0540    Assessment and Plan: 1. Code Status: Full code 2. Symptom Control: Pain/Dyspnea: Morphine 1-2 mg IV every 3 hrs prn Failure to Thrive/Weakness 3. Psycho/Social:  Emotional support offered to family, continued education on natural trajectory and expectations at EOL.  Shared  With son that prognosis is likely days to weeks.  4. Spiritual   Strong community church support 5. Disposition:  Home with previous plan; home health and PCS services  Patient Documents Completed or Given: Document Given Completed  Advanced Directives Pkt    MOST yes   DNR    Gone from My [facility]ight    Hard Choices yes     Time In Time Out Total Time Spent with Patient Total Overall Time  1130 1205 35 min 35 min     Greater than 50%  of this time was spent counseling and coordinating care related to the above assessment and plan.  Wadie Lessen NP  Palliative Medicine Team Team Phone # [phone] Pager [phone]  Discussed with Dr Broadus John and Dr Charlies Silvers 1

## 2014-03-09 NOTE — Progress Notes (Signed)
[facility] PROGRESS NOTE  Kiara Watkins CHE:[id number] DOB: 1927-02-08 DOA: 03/06/2014 PCP: Salena Saner., MD  Brief narrative: 80 y.o. female with history of metastatic colon cancer, CAD, hypertension who presented to [facility] ED 03/06/2014 with weakness and lethargy. Most of the cancer treatment has been received at [facility]. There was a brief involvement with hospice but pt currently full code and family still desires treatments if possible.  Assessment/Plan:   Principal Problem: Advanced stage colon cancer with hepatic metastases - per Dr. Baird Cancer of oncology (from [facility]) hospice was recommended and family tried having hospice following at home but then decided to cancel hospice.  - we have palliative care team during this admission and they are addressing goals of care with the patient and her family  Active Problems: Adult failure to thrive / Severe protein calorie malnutrition / Dehydration - secondary to advanced malignancy - she has very poor PO intake, taking ensure maybe 5 teaspoons per family Urinary tract infecction -continue ceftriaxone   Acute metabolic encephalopathy  - secondary to UTI, advanced malignancy and failure to thrive - mental status better this am per family Leukocytosis - secondary to UTI Acute renal failure - secondary to dehydration - continue IV fluids  Anemia of chronic disease - secondary to malignancy - hemoglobin is 11; no current indications for transfusion DVT prophylaxis - will stop Lovenox and use SCD's  Code Status: full code Family Communication: family updates at the bedside  Disposition Plan: home when stable  Consultants:  Palliative care team Antibiotics:  Ceftriaxone 03/06/2014 -->  Robbie Lis, MD  [facility] Pager [phone]  If 7PM-7AM, please contact night-coverage [email or web address] Password [facility] 03/09/2014, 3:16 PM   LOS: 3 days    HPI/Subjective: No overnight events.  Objective: Filed Vitals:    03/08/14 2350 03/09/14 0548 03/09/14 1357 03/09/14 1409  BP: 109/53 131/71 100/70 100/70  Pulse: 89 94 83   Temp: 96.7 F (35.9 C) 96.7 F (35.9 C) 97.6 F (36.4 C)   TempSrc: Axillary Axillary Oral   Resp: 16 16 16    Height:      Weight:      SpO2: 99% 100% 97%     Intake/Output Summary (Last 24 hours) at 03/09/14 1516 Last data filed at 03/09/14 0942  Gross per 24 hour  Intake 663.33 ml  Output    500 ml  Net 163.33 ml    Exam:   General:  Pt is alert, mo acute events.  Cardiovascular: Regular rate and rhythm, S1/S2 appreciated   Respiratory: Clear to auscultation bilaterally, no wheezing, no crackles, no rhonchi  Abdomen: Soft, non tender, non distended, bowel sounds present, no guarding  Extremities: No edema, pulses DP and PT palpable bilaterally  Neuro: Grossly nonfocal  Data Reviewed: Basic Metabolic Panel:  Recent Labs Lab 03/06/14 2330 03/07/14 0839 03/08/14 0520 03/09/14 0540  NA 144 143 139 136*  K 4.5 4.1 3.9 4.0  CL 106 105 101 98  CO2 23 22 19 20   GLUCOSE 57* 109* 133* 90  BUN 73* 71* 72* 69*  CREATININE 2.10* 2.11* 2.11* 1.87*  CALCIUM 10.8* 10.1 9.9 10.0   Liver Function Tests:  Recent Labs Lab 03/06/14 2330 03/07/14 0839 03/08/14 0520 03/09/14 0540  AST 412* 525* 420* 346*  ALT 125* 142* 135* 123*  ALKPHOS 647* 657* 676* 660*  BILITOT 9.1* 9.2* 8.9* 8.8*  PROT 6.2 6.0 5.9* 5.6*  ALBUMIN 1.6* 1.6* 1.5* 1.4*   No results found for this basename: LIPASE, AMYLASE,  in the last 168 hours  Recent Labs Lab 03/07/14 0935  AMMONIA 36   CBC:  Recent Labs Lab 03/06/14 2330 03/07/14 0839 03/08/14 0520  WBC 12.1* 14.2* 15.8*  NEUTROABS 11.1* 13.5*  --   HGB 10.9* 10.8* 11.0*  HCT 33.1* 34.0* 34.4*  MCV 80.5 84.0 83.1  PLT 286 249 231   Cardiac Enzymes: No results found for this basename: CKTOTAL, CKMB, CKMBINDEX, TROPONINI,  in the last 168 hours BNP: No components found with this basename: POCBNP,  CBG:  Recent  Labs Lab 03/08/14 1954 03/09/14 0008 03/09/14 0527 03/09/14 0752 03/09/14 1210  GLUCAP 85 88 89 97 90    Recent Results (from the past 240 hour(s))  URINE CULTURE     Status: None   Collection Time    03/08/14 11:07 AM      Result Value Ref Range Status   Specimen Description URINE, CATHETERIZED   Final   Special Requests Immunocompromised   Final   Culture  Setup Time     Final   Value: 03/08/2014 14:07     Performed at [facility] PENDING   Incomplete   Culture     Final   Value: Culture reincubated for better growth     Performed at [facility]   Report Status PENDING   Incomplete     Studies: No results found.  Scheduled Meds: . cefTRIAXone  1 g Intravenous Q24H  . dexamethasone  2 mg Oral BID WC  . enoxaparin (LOVENOX)  30 mg Subcutaneous Q24H  . feeding supplement  237 mL Oral BID BM  . fentaNYL  25 mcg Transdermal Q72H

## 2014-03-10 LAB — GLUCOSE, CAPILLARY
GLUCOSE-CAPILLARY: 100 mg/dL — AB (ref 70–99)
GLUCOSE-CAPILLARY: 89 mg/dL (ref 70–99)
Glucose-Capillary: 72 mg/dL (ref 70–99)
Glucose-Capillary: 78 mg/dL (ref 70–99)
Glucose-Capillary: 82 mg/dL (ref 70–99)
Glucose-Capillary: 95 mg/dL (ref 70–99)

## 2014-03-10 MED ORDER — DEXTROSE 5 % IV SOLN
INTRAVENOUS | Status: DC
  Administered 2014-03-10 – 2014-03-11 (×2): via INTRAVENOUS

## 2014-03-10 NOTE — Evaluation (Addendum)
Physical Therapy Evaluation Patient Details Name: Kiara Watkins MRN: [id number] DOB: 1927/06/17 Today's Date: 03/10/2014   History of Present Illness  Pt admitted 2 days after discharge from [facility] with weakness and lethargy, FTT, UTI, dehydration, and metabolic encephalopathy.  Pt has metastatic colon cancer that has spread to her liver.  Clinical Impression  On eval, pt required Max assist +2 for mobility-only able to briefly (~5 seconds) stand at EOB with use of walker. Attempted weightshifting and lateral steps while at EOB-pt unable-LEs buckled with each attempt. Will plan to follow on trial basis at request of family. Family has unrealistic expectations and goals, with respect to mobility, for patient. Pt is very weak and at high risk for falls when mobilized OOB so hoyer lift may be beneficial for safety reasons.      Follow Up Recommendations Supervision/Assistance - 24 hour; HHPT for education in home environment (positioning, safety, equipment management)    Equipment Recommendations  Wheelchair cushion;Wheelchair;Hospital bed; Education officer, environmental for [employer]       Precautions / Restrictions Precautions Precautions: Fall Precaution Comments: knees buckle Restrictions Weight Bearing Restrictions: No      Mobility  Bed Mobility Overal bed mobility: Needs Assistance Bed Mobility: Supine to Sit;Sit to Supine     Supine to sit: +2 for safety/equipment;+2 for physical assistance;Max assist Sit to supine: Max assist;+2 for physical assistance;+2 for safety/equipment   General bed mobility comments: Assist for bil LEs and trunk. Increased time. Utilized bedpad for scooting, positioning.   Transfers Overall transfer level: Needs assistance Equipment used: Rolling walker (2 wheeled) Transfers: Sit to/from Stand Sit to Stand: Max assist;+2 physical assistance;+2 safety/equipment         General transfer comment: x 3. Pt only able  weightbear briefly ~5 seconds before bil LEs buckling. Assist to rise, stabilize, control descent, position and maintain UEs on walker.   Ambulation/Gait             General Gait Details: Unable to safely attempt due to weakness/fall risk  Stairs            Wheelchair Mobility    Modified Rankin (Stroke Patients Only)       Balance Overall balance assessment: Needs assistance Sitting-balance support: No upper extremity supported;Feet supported Sitting balance-Leahy Scale: Poor Sitting balance - Comments: Lob posteriorly multiple times. Min assist to stabilize.    Standing balance support: Bilateral upper extremity supported Standing balance-Leahy Scale: Poor                               Pertinent Vitals/Pain Pt medicated at start of session    [place] expects to be discharged to:: Private residence Living Arrangements: Children Available Help at Discharge: Family;Available 24 hours/day Type of Home: House Home Access: Stairs to enter Entrance Stairs-Rails: Right Entrance Stairs-Number of Steps: 3 Home Layout: One level Home Equipment: Walker - 2 wheels;Bedside commode      Prior Function Level of Independence: Independent (3 weeks ago prior to admission to [facility])         Comments: Pt has not walked in 2 1/2 weeks per son.     Hand Dominance   Dominant Hand: Right    Extremity/Trunk Assessment   Upper Extremity Assessment: Defer to OT evaluation           Lower Extremity Assessment: Generalized weakness;RLE deficits/detail;LLE deficits/detail RLE Deficits / Details:  only able to weightbear briefly before buckling LLE Deficits / Details: only able to weightbear briefly before buckling  Cervical / Trunk Assessment: Kyphotic  Communication   Communication: Other (comment) (low volume, minimal communication)  Cognition Arousal/Alertness: Lethargic Behavior During Therapy: Flat affect Overall  Cognitive Status: Difficult to assess                      General Comments      Exercises        Assessment/Plan    PT Assessment Patient needs continued PT services  PT Diagnosis Difficulty walking;Generalized weakness;Acute pain;Altered mental status   PT Problem List Decreased strength;Decreased activity tolerance;Decreased balance;Decreased mobility;Decreased knowledge of use of DME;Pain;Decreased cognition  PT Treatment Interventions Functional mobility training;Therapeutic activities;Therapeutic exercise;Patient/family education;Balance training   PT Goals (Current goals can be found in the Care Plan section) Acute Rehab PT Goals Patient Stated Goal: Pt unable to state. PT Goal Formulation: With family Time For Goal Achievement: 03/24/14 Potential to Achieve Goals: Poor    Frequency Min 2X/week   Barriers to discharge        Co-evaluation               End of Session Equipment Utilized During Treatment: Gait belt Activity Tolerance: Patient limited by fatigue;Patient limited by pain Patient left: in bed;with call bell/phone within reach;with family/visitor present           Time: 0086-7619 PT Time Calculation (min): 19 min   Charges:   PT Evaluation $Initial PT Evaluation Tier I: 1 Procedure PT Treatments $Therapeutic Activity: 8-22 mins   PT G Codes:          Weston Anna, MPT Pager: [phone]

## 2014-03-10 NOTE — Evaluation (Signed)
Occupational Therapy Evaluation Patient Details Name: Kiara Watkins MRN: [id number] DOB: 11-08-1927 Today's Date: 03/10/2014    History of Present Illness Pt admitted 2 days after discharge from [facility] with weakness and lethargy, FTT, UTI, dehydration, and metabolic encephalopathy.  Pt has metastatic colon cancer that has spread to her liver.   Clinical Impression   Pt requires +2 assist for all mobility and is total care for ADL.  Pt with poor prognosis, but family wants for pt to receive all available treatment.  Palliative care is appropriately involved. No further OT recommended.  Please reconsult if pt improves with PT.   Follow Up Recommendations  No OT follow up    Equipment Recommendations  Wheelchair (measurements OT);Wheelchair cushion (measurements OT);Hospital bed,hoyer lift   Recommendations for Other Services       Precautions / Restrictions Precautions Precautions: Fall Precaution Comments: knees buckle Restrictions Weight Bearing Restrictions: No      Mobility Bed Mobility Overal bed mobility: +2 for physical assistance;Needs Assistance Bed Mobility: Supine to Sit;Sit to Supine     Supine to sit: +2 for physical assistance;Max assist Sit to supine: +2 for physical assistance;Max assist      Transfers Overall transfer level: Needs assistance   Transfers: Sit to/from Stand Sit to Stand: +2 physical assistance;Max assist (performed x 3)              Balance Overall balance assessment: Needs assistance Sitting-balance support: Feet supported Sitting balance-Leahy Scale: Poor     Standing balance support: Bilateral upper extremity supported Standing balance-Leahy Scale: Poor                              ADL Overall ADL's : Needs assistance/impaired                                       General ADL Comments: total assist for all ADL     Vision                     Perception      Praxis      Pertinent Vitals/Pain HA, meds provided by RN     Hand Dominance Right   Extremity/Trunk Assessment Upper Extremity Assessment Upper Extremity Assessment: Defer to OT evaluation   Lower Extremity Assessment Lower Extremity Assessment: Generalized weakness;RLE deficits/detail;LLE deficits/detail RLE Deficits / Details: only able to weightbear briefly before buckling LLE Deficits / Details: only able to weightbear briefly before buckling   Cervical / Trunk Assessment Cervical / Trunk Assessment: Kyphotic   Communication Communication Communication: Other (comment) (low volume, minimal communication)   Cognition Arousal/Alertness: Lethargic Behavior During Therapy: Flat affect Overall Cognitive Status: Difficult to assess                     General Comments       Exercises       Shoulder Instructions      Home Living Family/patient expects to be discharged to:: Private residence Living Arrangements: Children Available Help at Discharge: Family;Available 24 hours/day Type of Home: House Home Access: Stairs to enter [employer] of Steps: 3 Entrance Stairs-Rails: Right Home Layout: One level     Bathroom Shower/Tub: Teacher, early years/pre: Standard     Home Equipment: Environmental consultant - 2 wheels;Bedside commode  Prior Functioning/Environment Level of Independence: Independent (3 weeks ago prior to admission to [facility])        Comments: Pt has not walked in 2 1/2 weeks per son.    OT Diagnosis:     OT Problem List:     OT Treatment/Interventions:      OT Goals(Current goals can be found in the care plan section) Acute Rehab OT Goals Patient Stated Goal: Pt unable to state.  OT Frequency:     Barriers to D/C:            Co-evaluation              End of Session    Activity Tolerance: Patient limited by fatigue;Patient limited by lethargy Patient left: in bed;with call bell/phone  within reach;with family/visitor present   Time: 1420-1516 OT Time Calculation (min): 56 min Charges:  OT General Charges $OT Visit: 1 Procedure OT Evaluation $Initial OT Evaluation Tier I: 1 Procedure OT Treatments $Therapeutic Activity: 8-22 mins G-Codes:    Haze Boyden Cindee Mclester 2014-03-15, 3:20 PM [phone]

## 2014-03-10 NOTE — Progress Notes (Addendum)
[facility] PROGRESS NOTE  Kiara Watkins WFU:[id number] DOB: [birth year] DOA: 03/06/2014 PCP: Salena Saner., MD  Brief narrative: 80 y.o. female with history of metastatic colon cancer, CAD, hypertension who presented to [facility] ED 03/06/2014 with weakness and lethargy. Most of the cancer treatment has been received at [facility]. There was a brief involvement with hospice but pt currently full code and family still desires treatments if possible.   Assessment/Plan:   Principal Problem:  Advanced stage colon cancer with hepatic metastases  - per Dr. Baird Cancer of oncology (from [facility]) hospice was recommended and family tried having hospice following at home but then decided to cancel hospice.  - we have palliative care team who are addressing goals of care with the patient and her family  Active Problems:  Adult failure to thrive / Severe protein calorie malnutrition / Dehydration  - secondary to advanced malignancy  - no significant improvement in PO intake  Urinary tract infecction  - continue ceftriaxone  - urine culture growing Enterococcus species; follow up on sensitivity report  Acute metabolic encephalopathy  - secondary to UTI, advanced malignancy and failure to thrive  Leukocytosis  - secondary to UTI  Acute renal failure  - secondary to dehydration  - continue IV fluids  Anemia of chronic disease  - secondary to malignancy  - hemoglobin is 11; no current indications for transfusion  DVT prophylaxis  - stopped Lovenox and using SCD's   Code Status: full code  Family Communication: family updates at the bedside  Disposition Plan: home when stable; needs PT eval  Consultants:  Palliative care team Antibiotics:  Ceftriaxone 03/06/2014 -->  Robbie Lis, MD  [facility] Pager [phone]  If 7PM-7AM, please contact night-coverage [email or web address] Password [email or web address] 03/10/2014, 1:36 PM   LOS: 4 days    HPI/Subjective: No overnight events.  Objective: Filed  Vitals:   03/09/14 1357 03/09/14 1409 03/09/14 2100 03/10/14 0555  BP: 100/70 100/70 109/54 111/58  Pulse: 83  83 88  Temp: 97.6 F (36.4 C)  96.7 F (35.9 C) 97.6 F (36.4 C)  TempSrc: Oral  Oral Oral  Resp: 16  16 12   Height:      Weight:      SpO2: 97%  99% 98%    Intake/Output Summary (Last 24 hours) at 03/10/14 1336 Last data filed at 03/10/14 1331  Gross per 24 hour  Intake 1228.33 ml  Output    350 ml  Net 878.33 ml    Exam:   General:  Pt is sleeping, no acute distress  Cardiovascular: Regular rate and rhythm, S1/S2, no murmurs, no rubs, no gallops  Respiratory: Clear to auscultation bilaterally, no wheezing, no crackles, no rhonchi  Abdomen: Soft, non tender, non distended, bowel sounds present, no guarding  Extremities: +1 LE edema, pulses DP and PT palpable bilaterally  Neuro: Grossly nonfocal  Data Reviewed: Basic Metabolic Panel:  Recent Labs Lab 03/06/14 2330 03/07/14 0839 03/08/14 0520 03/09/14 0540  NA 144 143 139 136*  K 4.5 4.1 3.9 4.0  CL 106 105 101 98  CO2 23 22 19 20   GLUCOSE 57* 109* 133* 90  BUN 73* 71* 72* 69*  CREATININE 2.10* 2.11* 2.11* 1.87*  CALCIUM 10.8* 10.1 9.9 10.0   Liver Function Tests:  Recent Labs Lab 03/06/14 2330 03/07/14 0839 03/08/14 0520 03/09/14 0540  AST 412* 525* 420* 346*  ALT 125* 142* 135* 123*  ALKPHOS 647* 657* 676* 660*  BILITOT 9.1* 9.2* 8.9* 8.8*  PROT 6.2 6.0  5.9* 5.6*  ALBUMIN 1.6* 1.6* 1.5* 1.4*   No results found for this basename: LIPASE, AMYLASE,  in the last 168 hours  Recent Labs Lab 03/07/14 0935  AMMONIA 36   CBC:  Recent Labs Lab 03/06/14 2330 03/07/14 0839 03/08/14 0520  WBC 12.1* 14.2* 15.8*  NEUTROABS 11.1* 13.5*  --   HGB 10.9* 10.8* 11.0*  HCT 33.1* 34.0* 34.4*  MCV 80.5 84.0 83.1  PLT 286 249 231   Cardiac Enzymes: No results found for this basename: CKTOTAL, CKMB, CKMBINDEX, TROPONINI,  in the last 168 hours BNP: No components found with this basename:  POCBNP,  CBG:  Recent Labs Lab 03/09/14 2108 03/10/14 0034 03/10/14 0441 03/10/14 0752 03/10/14 1245  GLUCAP 91 95 78 72 100*    URINE CULTURE     Status: None   Collection Time    03/08/14 11:07 AM      Result Value Ref Range Status   Specimen Description URINE, CATHETERIZED   Final   Value: ENTEROCOCCUS SPECIES     Performed at [facility]   Report Status PENDING   Incomplete     Studies: No results found.  Scheduled Meds: . cefTRIAXone (ROCEPHIN)  IV  1 g Intravenous Q24H  . dexamethasone  2 mg Oral BID WC  . feeding supplement (ENSURE COMPLETE)  237 mL Oral BID BM  . fentaNYL  25 mcg Transdermal Q72H   Continuous Infusions: . dextrose 50 mL/hr at 03/10/14 0256

## 2014-03-11 DIAGNOSIS — E43 Unspecified severe protein-calorie malnutrition: Secondary | ICD-10-CM

## 2014-03-11 LAB — GLUCOSE, CAPILLARY
GLUCOSE-CAPILLARY: 83 mg/dL (ref 70–99)
GLUCOSE-CAPILLARY: 72 mg/dL (ref 70–99)
GLUCOSE-CAPILLARY: 83 mg/dL (ref 70–99)
Glucose-Capillary: 82 mg/dL (ref 70–99)

## 2014-03-11 LAB — BASIC METABOLIC PANEL
BUN: 67 mg/dL — AB (ref 6–23)
CALCIUM: 10.1 mg/dL (ref 8.4–10.5)
CO2: 20 meq/L (ref 19–32)
Chloride: 93 mEq/L — ABNORMAL LOW (ref 96–112)
Creatinine, Ser: 1.61 mg/dL — ABNORMAL HIGH (ref 0.50–1.10)
GFR calc Af Amer: 33 mL/min — ABNORMAL LOW (ref >90)
GFR, EST NON AFRICAN AMERICAN: 28 mL/min — AB (ref >90)
GLUCOSE: 87 mg/dL (ref 70–99)
Potassium: 4.3 mEq/L (ref 3.7–5.3)
Sodium: 129 mEq/L — ABNORMAL LOW (ref 137–147)

## 2014-03-11 LAB — URINE CULTURE: Colony Count: 30000

## 2014-03-11 MED ORDER — ONDANSETRON 4 MG PO TBDP: 4.0000 mg | ORAL_TABLET | Freq: Three times a day (TID) | ORAL | Status: AC | PRN

## 2014-03-11 MED ORDER — DEXTROSE 5 % IV SOLN: 1000.0000 mL | INTRAVENOUS | Status: AC

## 2014-03-11 MED ORDER — ENSURE COMPLETE PO LIQD: 237.0000 mL | Freq: Two times a day (BID) | ORAL | Status: AC

## 2014-03-11 MED ORDER — DEXAMETHASONE 1 MG/ML PO CONC: 1.0000 mg | Freq: Two times a day (BID) | ORAL | Status: AC

## 2014-03-11 MED ORDER — MORPHINE SULFATE (CONCENTRATE) 10 MG /0.5 ML PO SOLN: 10.0000 mg | ORAL | Status: AC | PRN

## 2014-03-11 MED ORDER — SODIUM CHLORIDE 0.9 % IV SOLN: 1000.0000 mL | Freq: Once | INTRAVENOUS | Status: AC

## 2014-03-11 MED ORDER — BIOTENE DRY MOUTH MT LIQD: 15.0000 mL | Freq: Two times a day (BID) | OROMUCOSAL | Status: AC

## 2014-03-11 NOTE — Progress Notes (Addendum)
CSW received notification that pt needing ambulance transport home.  CSW met with pt son at bedside and confirmed address.  Awaiting for hospital bed to be delivered to pt home in order to arrange transport.  CSW discussed with RN who will inform CSW when pt son notifies that hospital bed has been delivered.  CSW to continue to follow to arrange transport.  Addendum 3:50 pm:   CSW received notification from RN that pt family reports that pt hospital bed has arrived at home.  CSW arranged ambulance transportation.  No further social work needs identified at this time.  CSW signing off.   Alison Murray, MSW, [place] Work [phone]

## 2014-03-11 NOTE — Progress Notes (Signed)
Discharge instructions discussed with patient's son,he verbalized understanding. Prescriptions given to son. They are still waiting for patient's bed to be delivered to home. Sandie Ano RN

## 2014-03-11 NOTE — Care Management Note (Signed)
Cm spoke with patient at the bedside concerning discharge planning. Pt's son verified him and his spouse currently provide 24 care. Pt also has private duty aide services and Skilled Nursing provided by [place]. Pt's son request hospital bed. Md order entered. Pt's son states pt will require transportation at discharge. CSW notified.    Venita Lick Rupa Lagan,MSN,RN [phone]

## 2014-03-11 NOTE — Discharge Instructions (Signed)
Failure to Thrive, Adult Adult failure to thrive is a condition that some older people develop. People with this condition are able to do fewer and fewer activities over time. They may lose interest in being with friends or may not want to eat or drink. This is not a normal part of aging. Many things can cause this. Health problems, long-term disease, depression, bad eating habits, cognitive impairment, or disability may play a role in the development of this condition. Most of the time, it is important to treat whatever is causing failure to thrive. Sometimes, though, it might not be possible to treat the condition. The person could be nearing the end of life. Then, treatment could make the person suffer longer. CAUSES Sometimes, no specific cause can be found. Factors that have been linked to failure to thrive include:  Diseases and medical conditions, such as:  Cancer.  Diabetes.  Stomach and intestinal (gastrointestinal) problems.  Lung disease.  Liver disease.  Kidney disease.  Heart problems.  Thyroid disease.  Neurologic problems.  Vitamin deficiencies.  Disability. This may be the result of:  A broken hip.  A stroke.  Very bad arthritis.  Infections that last a long time.  A long recovery from a surgery.  Mental health issues.  Medicines.  Eating problems.  Medicine for certain conditions. These conditions include:  Parkinson's disease.  Seizure disorder.  Anxiety.  Pain.  High blood pressure.  Depression.  Infections. SYMPTOMS  Losing weight (more than 5% of total body weight).  Getting more tired than usual after an activity.  Having trouble getting up after sitting.  Not being hungry or thirsty.  Not getting out of bed.  Not wanting to do usual activities.  Being depressed.  Getting infections often.  Having bedsores.  Taking a long time to recover after an injury or a surgery.  Weakness. DIAGNOSIS A physical exam can help  a caregiver decide if someone has adult failure to thrive. This may include questions about the person's health presently and in the past. It also may include questions about behavior and mood, such as:  Has activity changed?  Does the person seem sad?  Are eating habits different? The caregiver may ask for a list of all medicines taken because certain medicines can lead to this condition. The list should include prescription and over-the-counter medicines. The caregiver will likely order some tests. These may include:  Blood tests to check for infection, certain diseases, deficiencies, hormone levels, malnutrition, or dehydration.  Urine tests to check for urinary tract infection or kidney failure.  Imaging tests. Examples are an X-ray, a computed tomography (CT) scan, and magnetic resonance imaging (MRI).  Hearing tests.  Vision tests.  Cognitivetests to check thinking ability.  Activity tests to see if the person can do basic tasks like bathing and dressing. There also are tests to check if someone can shop, cook, or move around safely. The caregiver will check if the person is eating enough healthy food. This may include:  Checking weight.  Having blood tested for cholesterol and protein levels.  Seeing if anything else might be making it hard to eat (tooth problems, poorly fitted dentures, trouble swallowing). The person may need to see a specialist to help with diagnosis or treatment. These specialists may include a speech therapist, physical therapist, occupational therapist, dietitian, or social worker.  TREATMENT Treatment for adult failure to thrive depends on the cause. Caregivers also must decide if a treatment has a good chance of working. It  often takes a team of caregivers to find the right treatment. Options may include:  Treatments to cure a disease that can cause adult failure to thrive.  Talk therapy or medicine to treat depression.  A better diet. Eating more  often, adding nutritional supplements between meals, or taking vitamins may be suggested. Sometimes, medicine is prescribed to boost appetite.  Medicine changes or stopping a medicine.  Physical therapy.  Moving to a place that offers more aid. HOME CARE INSTRUCTIONS What needs to be done at home varies from person to person. This will depend on what caused the condition and how it is treated. However, basic guidelines include:  Taking any medicine prescribed by the caregiver. Following the directions carefully is important.  Eating healthy foods. There should be enough calories in each meal. Ask the caregiver if vitamins or nutritional supplements should be taken between meals. Consider talking with a dietitian.  Exercising. Strength training is important. A physical therapist can help set up an exercise program that fits the person.  Making sure the person is safe at home.  Talking with caregivers about what should be done if the person can no longer make decisions for himself or herself. SEEK MEDICAL CARE IF:  There are any questions about medicines.  There are questions about the effects of treatment.  The person is not able to eat well.  The person is not able to move around.  The person feels very sad or hopeless. SEEK IMMEDIATE MEDICAL CARE IF:   The person has thoughts of ending his or her life.  The person cannot eat or drink.  The person does not get out of bed.  Staying at home is no longer safe.  The person has a fever. Document Released: 01/20/2012 Document Reviewed: 01/20/2012 [facility] Patient Information 2014 [place].

## 2014-03-11 NOTE — Discharge Summary (Signed)
Physician Discharge Summary  Kiara Watkins JJK:[id number] DOB: 1927/04/29 DOA: 03/06/2014  PCP: Salena Saner., MD  Admit date: 03/06/2014 Discharge date: 03/11/2014  Recommendations for Outpatient Follow-up:  1. Continue normal saline IV infusion as before per home regimen every other day. Use rate of 50 cc/hr. Alternate with D5% IV infusion at the same rate, alternate every day so each solution to be given every other day.     Discharge Diagnoses:  Principal Problem:   Failure to thrive Active Problems:   Essential hypertension, benign   CAD, NATIVE VESSEL   Renal failure   Anemia   Protein-calorie malnutrition, severe   Palliative care encounter    Discharge Condition: medically stable for discharge home today  Diet recommendation: as pt able to tolerate   History of present illness:  80 y.o. female with history of metastatic colon cancer, CAD, hypertension who presented to [facility] ED 03/06/2014 with weakness and lethargy. Most of the cancer treatment has been received at [facility]. There was a brief involvement with hospice but pt currently full code and family agrees for her to go home.  Assessment/Plan:   Principal Problem:  Advanced stage colon cancer with hepatic metastases  - per Dr. Baird Cancer of oncology (from [facility]) hospice was recommended and family tried having hospice following at home but then decided to cancel hospice.   Active Problems:  Adult failure to thrive / Severe protein calorie malnutrition / Dehydration  - secondary to advanced malignancy  - no significant improvement in PO intake; pt able to swallow Urinary tract infecction  - continue ceftriaxone through today; no need for abx on discharge; only about 50 K colonies grew on urine culture so less likely true UTI Acute metabolic encephalopathy  - secondary to UTI, advanced malignancy and failure to thrive  Leukocytosis  - secondary to UTI  Acute renal failure  - secondary to dehydration  - continue  IV fluids as mentioned above  Anemia of chronic disease  - secondary to malignancy  - hemoglobin is 11; no current indications for transfusion  DVT prophylaxis  - stopped Lovenox and using SCD's in hospital  Code Status: full code  Family Communication: family updates at the bedside  Disposition Plan: home when stable; needs PT eval   Consultants:  Palliative care team Antibiotics:  Ceftriaxone 03/06/2014 --> 03/11/2014  Signed:  Robbie Lis, MD  [facility] 03/11/2014, 1:35 PM  Pager #: [phone]    Discharge Exam: Filed Vitals:   03/11/14 0444  BP: 102/55  Pulse: 79  Temp: 96.8 F (36 C)  Resp: 12   Filed Vitals:   03/09/14 2100 03/10/14 0555 03/10/14 2215 03/11/14 0444  BP: 109/54 111/58 114/72 102/55  Pulse: 83 88 91 79  Temp: 96.7 F (35.9 C) 97.6 F (36.4 C) 97.1 F (36.2 C) 96.8 F (36 C)  TempSrc: Oral Oral Oral Oral  Resp: 16 12 14 12   Height:      Weight:      SpO2: 99% 98% 99% 98%    General: Pt is alert, follows commands appropriately Cardiovascular: Regular rate and rhythm, S1/S2 appreciated  Respiratory: Clear to auscultation bilaterally, no wheezing, no crackles, no rhonchi Abdominal: Soft, non tender, non distended, bowel sounds +, no guarding Extremities: no cyanosis, pulses palpable bilaterally DP and PT Neuro: Grossly nonfocal  Discharge Instructions  Discharge Orders   Future Orders Complete By Expires   Call MD for:  difficulty breathing, headache or visual disturbances  As directed  Call MD for:  persistant dizziness or light-headedness  As directed    Call MD for:  persistant nausea and vomiting  As directed    Call MD for:  severe uncontrolled pain  As directed    Diet - low sodium heart healthy  As directed    Discharge instructions  As directed    Increase activity slowly  As directed        Medication List    STOP taking these medications       amLODipine-benazepril 10-20 MG per capsule  Commonly known  as:  LOTREL     furosemide 20 MG tablet  Commonly known as:  LASIX     oxyCODONE 15 MG immediate release tablet  Commonly known as:  ROXICODONE      TAKE these medications       antiseptic oral rinse Liqd  15 mLs by Mouth Rinse route 2 times daily at 12 noon and 4 pm.     dexamethasone 2 MG tablet  Commonly known as:  DECADRON  Take 2 mg by mouth 2 (two) times daily with a meal.     dextrose 5 % solution  Inject 1,000 mLs into the vein continuous. Please given D5% solution at 50 cc/hr on alternating days; alternate with normal saline infusion.     feeding supplement (ENSURE COMPLETE) Liqd  Take 237 mLs by mouth 2 (two) times daily between meals.     fentaNYL 25 MCG/HR patch  Commonly known as:  DURAGESIC - dosed mcg/hr  Place 25 mcg onto the skin every 3 (three) days. Due for change on 28th     latanoprost 0.005 % ophthalmic solution  Commonly known as:  XALATAN  Place 1 drop into both eyes at bedtime.     morphine CONCENTRATE 10 mg / 0.5 ml concentrated solution  Take 0.5 mLs (10 mg total) by mouth every 3 (three) hours as needed for severe pain.     nitroGLYCERIN 0.4 MG SL tablet  Commonly known as:  NITROSTAT  Place 0.4 mg under the tongue every 5 (five) minutes as needed. For chest pain     ondansetron 4 MG disintegrating tablet  Commonly known as:  ZOFRAN ODT  Take 1 tablet (4 mg total) by mouth every 8 (eight) hours as needed for nausea or vomiting.     sodium chloride 0.9 % infusion  Inject 1,000 mLs into the vein once.           Follow-up Information   Schedule an appointment as soon as possible for a visit with Salena Saner., MD. (As needed)    Specialty:  Internal Medicine   Contact information:   [place]  [phone]        The results of significant diagnostics from this hospitalization (including imaging, microbiology, ancillary and laboratory) are listed below for reference.    Significant  Diagnostic Studies: No results found.  Microbiology: Recent Results (from the past 240 hour(s))  URINE CULTURE     Status: None   Collection Time    03/08/14 11:07 AM      Result Value Ref Range Status   Specimen Description URINE, CATHETERIZED   Final   Special Requests Immunocompromised   Final   Culture  Setup Time     Final   Value: 03/08/2014 14:07     Performed at [facility]     Final   Value: 30,000 COLONIES/ML  Performed at [facility]     Final   Value: ENTEROCOCCUS SPECIES     Performed at [facility]   Report Status 03/11/2014 FINAL   Final   Organism ID, Bacteria ENTEROCOCCUS SPECIES   Final     Labs: Basic Metabolic Panel:  Recent Labs Lab 03/06/14 2330 03/07/14 0839 03/08/14 0520 03/09/14 0540 03/11/14 0415  NA 144 143 139 136* 129*  K 4.5 4.1 3.9 4.0 4.3  CL 106 105 101 98 93*  CO2 23 22 19 20 20   GLUCOSE 57* 109* 133* 90 87  BUN 73* 71* 72* 69* 67*  CREATININE 2.10* 2.11* 2.11* 1.87* 1.61*  CALCIUM 10.8* 10.1 9.9 10.0 10.1   Liver Function Tests:  Recent Labs Lab 03/06/14 2330 03/07/14 0839 03/08/14 0520 03/09/14 0540  AST 412* 525* 420* 346*  ALT 125* 142* 135* 123*  ALKPHOS 647* 657* 676* 660*  BILITOT 9.1* 9.2* 8.9* 8.8*  PROT 6.2 6.0 5.9* 5.6*  ALBUMIN 1.6* 1.6* 1.5* 1.4*   No results found for this basename: LIPASE, AMYLASE,  in the last 168 hours  Recent Labs Lab 03/07/14 0935  AMMONIA 36   CBC:  Recent Labs Lab 03/06/14 2330 03/07/14 0839 03/08/14 0520  WBC 12.1* 14.2* 15.8*  NEUTROABS 11.1* 13.5*  --   HGB 10.9* 10.8* 11.0*  HCT 33.1* 34.0* 34.4*  MCV 80.5 84.0 83.1  PLT 286 249 231   Cardiac Enzymes: No results found for this basename: CKTOTAL, CKMB, CKMBINDEX, TROPONINI,  in the last 168 hours BNP: BNP (last 3 results) No results found for this basename: PROBNP,  in the last 8760 hours CBG:  Recent Labs Lab 03/10/14 2007 03/11/14 0009  03/11/14 0422 03/11/14 0758 03/11/14 1209  GLUCAP 82 82 83 72 83    Time coordinating discharge: Over 30 minutes

## 2014-03-11 NOTE — Progress Notes (Signed)
[facility]  [facility] is providing the following services: Bed  If patient discharges after hours, please call [phone].   Linward Headland 03/11/2014, 10:35 AM

## 2014-03-12 NOTE — Progress Notes (Addendum)
Received call from Unit and stating they have received call from pt's family son, Aleighya Mcanelly # [phone] and [facility] RN with [facility]. Pt's HH RN Micheal Likens states pt oxygen level is 79% on RA at rest and spoke with attending, Dr. Leisa Lenz for oxygen orders. NCM contacted Dr. Charlies Silvers for RX for oxygen. Jonnie Finner RN CCM Case Mgmt phone [phone]  Address [place] [address] staying with son.

## 2014-03-12 NOTE — Progress Notes (Signed)
NCM spoke to Dr Charlies Silvers and orders received for oxygen. NCM contacted [facility] for delivery of oxygen asap to son's home at [address] # [phone]. Jonnie Finner RN CCM Case Mgmt phone [phone]

## 2014-03-12 NOTE — Progress Notes (Signed)
NCM spoke to [facility] and DME rep in route to home to deliver portable. Oxygen concentrator will be delivered this afternoon.

## 2014-03-29 NOTE — Consult Note (Signed)
I have reviewed and discussed the care of this patient in detail with the nurse practitioner including pertinent patient records, physical exam findings and data. I agree with details of this encounter.  

## 2014-04-11 DEATH — deceased
